# Patient Record
Sex: Female | Born: 1995 | Race: Black or African American | Hispanic: No | Marital: Single | Smoking: Never smoker
Health system: Southern US, Community
[De-identification: ages and names within clinical notes are randomized; demographics above are authoritative.]

## PROBLEM LIST (undated history)

## (undated) DIAGNOSIS — I10 Essential (primary) hypertension: Secondary | ICD-10-CM

## (undated) DIAGNOSIS — D649 Anemia, unspecified: Secondary | ICD-10-CM

## (undated) DIAGNOSIS — E669 Obesity, unspecified: Secondary | ICD-10-CM

## (undated) HISTORY — PX: WRIST SURGERY: SHX841

---

## 2020-12-04 ENCOUNTER — Emergency Department (HOSPITAL_BASED_OUTPATIENT_CLINIC_OR_DEPARTMENT_OTHER)
Admission: EM | Admit: 2020-12-04 | Discharge: 2020-12-04 | Disposition: A | Discharge status: Home / Self Care | Payer: Medicaid Other | Attending: Emergency Medicine | Admitting: Emergency Medicine

## 2020-12-04 ENCOUNTER — Emergency Department (HOSPITAL_BASED_OUTPATIENT_CLINIC_OR_DEPARTMENT_OTHER): Payer: Medicaid Other

## 2020-12-04 ENCOUNTER — Encounter (HOSPITAL_BASED_OUTPATIENT_CLINIC_OR_DEPARTMENT_OTHER): Payer: Self-pay | Admitting: *Deleted

## 2020-12-04 ENCOUNTER — Other Ambulatory Visit: Payer: Self-pay

## 2020-12-04 DIAGNOSIS — K8020 Calculus of gallbladder without cholecystitis without obstruction: Secondary | ICD-10-CM

## 2020-12-04 DIAGNOSIS — O99011 Anemia complicating pregnancy, first trimester: Secondary | ICD-10-CM | POA: Diagnosis not present

## 2020-12-04 DIAGNOSIS — O26611 Liver and biliary tract disorders in pregnancy, first trimester: Secondary | ICD-10-CM | POA: Diagnosis not present

## 2020-12-04 DIAGNOSIS — D649 Anemia, unspecified: Secondary | ICD-10-CM

## 2020-12-04 DIAGNOSIS — Z3A01 Less than 8 weeks gestation of pregnancy: Secondary | ICD-10-CM | POA: Diagnosis not present

## 2020-12-04 DIAGNOSIS — Z3490 Encounter for supervision of normal pregnancy, unspecified, unspecified trimester: Secondary | ICD-10-CM

## 2020-12-04 DIAGNOSIS — Z3201 Encounter for pregnancy test, result positive: Secondary | ICD-10-CM | POA: Insufficient documentation

## 2020-12-04 DIAGNOSIS — R072 Precordial pain: Secondary | ICD-10-CM | POA: Insufficient documentation

## 2020-12-04 DIAGNOSIS — R0789 Other chest pain: Secondary | ICD-10-CM

## 2020-12-04 DIAGNOSIS — O99891 Other specified diseases and conditions complicating pregnancy: Secondary | ICD-10-CM | POA: Diagnosis present

## 2020-12-04 DIAGNOSIS — R1011 Right upper quadrant pain: Secondary | ICD-10-CM

## 2020-12-04 HISTORY — DX: Calculus of gallbladder without cholecystitis without obstruction: K80.20

## 2020-12-04 LAB — CBC
HCT: 30.4 % — ABNORMAL LOW (ref 36.0–46.0)
Hemoglobin: 9.2 g/dL — ABNORMAL LOW (ref 12.0–15.0)
MCH: 19.7 pg — ABNORMAL LOW (ref 26.0–34.0)
MCHC: 30.3 g/dL (ref 30.0–36.0)
MCV: 65 fL — ABNORMAL LOW (ref 80.0–100.0)
Platelets: 589 10*3/uL — ABNORMAL HIGH (ref 150–400)
RBC: 4.68 MIL/uL (ref 3.87–5.11)
RDW: 19.7 % — ABNORMAL HIGH (ref 11.5–15.5)
WBC: 11.4 10*3/uL — ABNORMAL HIGH (ref 4.0–10.5)
nRBC: 0.2 % (ref 0.0–0.2)

## 2020-12-04 LAB — COMPREHENSIVE METABOLIC PANEL
ALT: 15 U/L (ref 0–44)
AST: 19 U/L (ref 15–41)
Albumin: 3.1 g/dL — ABNORMAL LOW (ref 3.5–5.0)
Alkaline Phosphatase: 69 U/L (ref 38–126)
Anion gap: 7 (ref 5–15)
BUN: 5 mg/dL — ABNORMAL LOW (ref 6–20)
CO2: 22 mmol/L (ref 22–32)
Calcium: 8.3 mg/dL — ABNORMAL LOW (ref 8.9–10.3)
Chloride: 104 mmol/L (ref 98–111)
Creatinine, Ser: 0.65 mg/dL (ref 0.44–1.00)
GFR, Estimated: >60 mL/min (ref >60)
Glucose, Bld: 93 mg/dL (ref 70–99)
Potassium: 3.4 mmol/L — ABNORMAL LOW (ref 3.5–5.1)
Sodium: 133 mmol/L — ABNORMAL LOW (ref 135–145)
Total Bilirubin: 0.4 mg/dL (ref 0.3–1.2)
Total Protein: 7.4 g/dL (ref 6.5–8.1)

## 2020-12-04 LAB — URINALYSIS, ROUTINE W REFLEX MICROSCOPIC
Bilirubin Urine: NEGATIVE
Glucose, UA: NEGATIVE mg/dL
Hgb urine dipstick: NEGATIVE
Ketones, ur: NEGATIVE mg/dL
Leukocytes,Ua: NEGATIVE
Nitrite: NEGATIVE
Protein, ur: NEGATIVE mg/dL
Specific Gravity, Urine: 1.015 (ref 1.005–1.030)
pH: 7 (ref 5.0–8.0)

## 2020-12-04 LAB — HCG, QUANTITATIVE, PREGNANCY: hCG, Beta Chain, Quant, S: 43 m[IU]/mL — ABNORMAL HIGH (ref <5)

## 2020-12-04 LAB — TROPONIN I (HIGH SENSITIVITY): Troponin I (High Sensitivity): 3 ng/L (ref <18)

## 2020-12-04 LAB — LIPASE, BLOOD: Lipase: 21 U/L (ref 11–51)

## 2020-12-04 LAB — PREGNANCY, URINE: Preg Test, Ur: POSITIVE — AB

## 2020-12-04 NOTE — ED Triage Notes (Signed)
Burning in her upper chest, abdominal cramping. Nausea a few nights ago.

## 2020-12-04 NOTE — Discharge Instructions (Addendum)
Begin taking a prenatal vitamin with iron.  Establish prenatal care with an OB specialist.  If you develop any lower abdominal/pelvic pain, vaginal bleeding, or other concerning symptoms regarding her pregnancy, please report to the [facility] to the maternity assessment unit at entrance C.  This is near the emergency department but not the same entrance.  Establish primary care.  It is important to have your hemoglobin rechecked.  Return emergency department tomorrow for ultrasound imaging of your gallbladder. Report to the front desk and inform them you are here for scheduled ultrasound. If you are having persistent symptoms tomorrow upon arrival, it is recommended you check into the ED for reevaluation instead. (you can have ultrasound done during re-evaluation)  You can take Tums as needed for heartburn. You can take Tylenol as needed for pain. Avoid heavy lifting at work for the next few days.  IMPORTANT PATIENT INSTRUCTIONS:  You have been scheduled for an Outpatient Ultrasound.     Your appointment has been scheduled for:  10:00 AM on 12/05/2020.   If your appointment is scheduled for a Saturday, Sunday or holiday, please go to the [facility] Emergency Department Registration Desk at least 15 minutes prior to your appointment time and tell them you are there for an ultrasound.     If your appointment is scheduled for a weekday (Monday - Friday), please go directly to the [facility] Radiology Department reception area at least 15 minutes prior to your appointment time and tell them you are there for an ultrasound.     Please call [phone] with questions.

## 2020-12-04 NOTE — ED Provider Notes (Signed)
[facility] EMERGENCY DEPARTMENT Provider Note   CSN: [id number] Arrival date & time: 12/04/20  1425     History Chief Complaint  Patient presents with  . Abdominal Pain  . Chest Pain    Lauren Morales is a 24 y.o. female without significant past medical history, presenting emergency department with complaint of upper chest pain that began a couple of days ago. Patient states pain feels like a burning pain that is worse with palpation. She states it feels like heartburn. She does do some heavy lifting at work at [employer] though does not recall a particular injury.  She is not having any shortness of breath or pain with breathing.  No associated diaphoresis or nausea.  It is not made worse or better with meals.  No unilateral leg pain or swelling.  She does report right upper quadrant crampy abdominal pain that comes and goes.  Her pain is not postprandial.  No other associated symptoms noted.  It is not severe.  No known history of gallstones.  Patient recently relocated here from [place], and has not established PCP.   Pregnancy test obtained in triage is positive.  She was unaware of this.  She is sexually active.  LMP is 11/04/2020.  She has had intercourse since her last menstrual period.  She is having no lower abdominal pain, no vaginal bleeding or abnormal discharge.  The history is provided by the patient.       History reviewed. No pertinent past medical history.  There are no problems to display for this patient.   History reviewed. No pertinent surgical history.   OB History   No obstetric history on file.     No family history on file.  Social History   Tobacco Use  . Smoking status: Never Smoker  . Smokeless tobacco: Never Used  Substance Use Topics  . Alcohol use: Yes  . Drug use: Not Currently    Home Medications Prior to Admission medications   Not on File    Allergies    Patient has no known allergies.  Review of Systems   Review  of Systems  All other systems reviewed and are negative.   Physical Exam Updated Vital Signs BP 121/88 (BP Location: Right Arm)   Pulse 88   Temp 98.4 F (36.9 C) (Oral)   Resp 18   Ht 5\' 5"  (1.651 m)   Wt (!) 153.9 kg   LMP 11/04/2020   SpO2 100%   BMI 56.46 kg/m   Physical Exam Vitals and nursing note reviewed.  Constitutional:      General: She is not in acute distress.    Appearance: She is well-developed and well-nourished. She is obese. She is not ill-appearing.  HENT:     Head: Normocephalic and atraumatic.  Eyes:     Conjunctiva/sclera: Conjunctivae normal.  Cardiovascular:     Rate and Rhythm: Normal rate and regular rhythm.     Pulses: Normal pulses.     Heart sounds: Normal heart sounds.  Pulmonary:     Effort: Pulmonary effort is normal. No respiratory distress.     Breath sounds: Normal breath sounds.  Chest:     Chest wall: Tenderness (Significant tenderness to superior portion of sternum) present.  Abdominal:     General: Bowel sounds are normal.     Palpations: Abdomen is soft.     Tenderness: There is abdominal tenderness in the right upper quadrant and epigastric area. There is no guarding or rebound.  Musculoskeletal:     Right lower leg: No edema.     Left lower leg: No edema.  Skin:    General: Skin is warm.  Neurological:     Mental Status: She is alert.  Psychiatric:        Mood and Affect: Mood and affect normal.        Behavior: Behavior normal.     ED Results / Procedures / Treatments   Labs (all labs ordered are listed, but only abnormal results are displayed) Labs Reviewed  COMPREHENSIVE METABOLIC PANEL - Abnormal; Notable for the following components:      Result Value   Sodium 133 (*)    Potassium 3.4 (*)    BUN 5 (*)    Calcium 8.3 (*)    Albumin 3.1 (*)    All other components within normal limits  CBC - Abnormal; Notable for the following components:   WBC 11.4 (*)    Hemoglobin 9.2 (*)    HCT 30.4 (*)    MCV 65.0  (*)    MCH 19.7 (*)    RDW 19.7 (*)    Platelets 589 (*)    All other components within normal limits  URINALYSIS, ROUTINE W REFLEX MICROSCOPIC - Abnormal; Notable for the following components:   APPearance HAZY (*)    All other components within normal limits  PREGNANCY, URINE - Abnormal; Notable for the following components:   Preg Test, Ur POSITIVE (*)    All other components within normal limits  HCG, QUANTITATIVE, PREGNANCY - Abnormal; Notable for the following components:   hCG, Beta Chain, Quant, S 43 (*)    All other components within normal limits  LIPASE, BLOOD  TROPONIN I (HIGH SENSITIVITY)    EKG EKG Interpretation  Date/Time:  Thursday December 04 2020 14:53:42 EST Ventricular Rate:  101 PR Interval:  112 QRS Duration: 70 QT Interval:  336 QTC Calculation: 435 R Axis:   34 Text Interpretation: Sinus tachycardia Nonspecific T wave abnormality Abnormal ECG Confirmed by Marianna Fuss ([id number]) on 12/04/2020 6:42:32 PM   Radiology DG Chest 2 View  Result Date: 12/04/2020 CLINICAL DATA:  Upper chest pain, abdominal cramping, nausea several nights prior EXAM: CHEST - 2 VIEW COMPARISON:  None. FINDINGS: Streaky opacities towards the lung bases, could reflect atelectasis or early infection in the appropriate clinical setting. No pneumothorax. No effusion. No convincing features of edema. The cardiomediastinal contours are unremarkable. No acute osseous or soft tissue abnormality. Telemetry leads overlie the chest. IMPRESSION: Streaky opacities towards the lung bases, could reflect atelectasis or early infection in the appropriate clinical setting. Electronically Signed   By: Kreg Shropshire M.D.   On: 12/04/2020 20:05    Procedures Procedures (including critical care time)  Medications Ordered in ED Medications - No data to display  ED Course  I have reviewed the triage vital signs and the nursing notes.  Pertinent labs & imaging results that were available during my  care of the patient were reviewed by me and considered in my medical decision making (see chart for details).  Clinical Course as of 12/04/20 2143  Thu Dec 04, 2020  1953 Discussed hCG level with on-call OB Dr. Mayford Knife.  He recommends routine outpatient OB follow-up as appropriate.  Reports likely very early gestation with recent conception. [JR]  2038 DG Chest 2 View No cough or fever, defer abx [JR]    Clinical Course User Index [JR] Hampton Wixom, [place] N, PA-C   MDM Rules/Calculators/A&P  Patient presenting for burning superior sternal pain that is reproducible with palpation, as well as some intermittent cramping right upper quadrant abdominal pain.  Sternal pain is described as heartburn that was easily reproducible with palpation.  She states she does some heavy lifting at [employer].  Consider chest wall strain versus costochondritis.  Right upper quadrant pain is reproducible on exam, no peritoneal signs.  She is afebrile with normal LFTs, no leukocytosis.  Doubt acute cholecystitis.  Ultrasound imaging is not currently available at this time of evening in the ED.  However, consider uncomplicated cholelithiasis.    EKG appears nonischemic.  Troponin is negative.  UA is negative.  Lipase is normal limits.  She does have some anemia noted though has no active bleeding and is asymptomatic without tachycardia or hypotension.   She is incidentally found to have positive pregnancy test today with quantitative  hCG of 43.  This is discussed with OB on-call Dr. Mayford Knife, recommends this is likely just very early pregnancy and is appropriate for routine outpatient follow-up given absence of concerning symptoms.  Patient is scheduled for right upper quadrant ultrasound, to return tomorrow to this ED for scheduled imaging to evaluate for cholelithiasis.  She is instructed to check back into the ED if she is having persistent symptoms or any worsening symptoms.  Patient is provided  with OB referral for routine prenatal care, as well as PCP referral to establish PCP, as well to recheck anemia.  Chest pain seems likely musculoskeletal in nature versus GERD.  Presentation is not consistent with PE, she has no shortness of breath, pleuritic pain, cough, fever, tachycardia, tachypnea or hypoxia, no unilateral leg pain or swelling.  Pregnancy is very early on, unlikely pose as risk factor for PE at this very early stage in pregnancy.  Recommend symptomatic management, strict return precautions.  Patient verbalized understanding, agrees with work-up and care plan at this time.  Patient presentation and work-up discussed with attending Dr. Stevie Kern, who guided treatment and care plan. Final Clinical Impression(s) / ED Diagnoses Final diagnoses:  RUQ abdominal pain  Sternum pain  Pregnancy at early stage  Anemia, unspecified type    Rx / DC Orders ED Discharge Orders    None       Kel Senn, [place] N, PA-C 12/04/20 2217    Milagros Loll, MD 12/05/20 1248

## 2020-12-05 ENCOUNTER — Emergency Department (HOSPITAL_BASED_OUTPATIENT_CLINIC_OR_DEPARTMENT_OTHER)
Admit: 2020-12-05 | Discharge: 2020-12-05 | Disposition: A | Discharge status: Home / Self Care | Payer: Medicaid Other | Attending: Emergency Medicine | Admitting: Emergency Medicine

## 2020-12-05 NOTE — ED Provider Notes (Signed)
Pt returned for a gallbladder US.  The [place] does show cholelithiasis with borderline gb wall thickening.  +murphy's sign.  I spoke with pt and pain has improved.  She is nontoxic.  I told her to check back in if she worsens.  She does not want to right now.   Pt is encouraged to avoid fatty and greasy foods.  F/u with general surgery.   Jacalyn Lefevre, MD 12/05/20 1126

## 2020-12-17 ENCOUNTER — Other Ambulatory Visit: Payer: Self-pay

## 2020-12-17 DIAGNOSIS — R109 Unspecified abdominal pain: Secondary | ICD-10-CM | POA: Diagnosis not present

## 2020-12-17 DIAGNOSIS — O4691 Antepartum hemorrhage, unspecified, first trimester: Secondary | ICD-10-CM | POA: Diagnosis not present

## 2020-12-17 DIAGNOSIS — Z3A01 Less than 8 weeks gestation of pregnancy: Secondary | ICD-10-CM | POA: Insufficient documentation

## 2020-12-17 DIAGNOSIS — O26891 Other specified pregnancy related conditions, first trimester: Secondary | ICD-10-CM | POA: Diagnosis not present

## 2020-12-17 LAB — PREGNANCY, URINE: Preg Test, Ur: POSITIVE — AB

## 2020-12-17 LAB — CBC WITH DIFFERENTIAL/PLATELET
Abs Immature Granulocytes: 0.05 10*3/uL (ref 0.00–0.07)
Basophils Absolute: 0.1 10*3/uL (ref 0.0–0.1)
Basophils Relative: 0 %
Eosinophils Absolute: 0.1 10*3/uL (ref 0.0–0.5)
Eosinophils Relative: 1 %
HCT: 34.4 % — ABNORMAL LOW (ref 36.0–46.0)
Hemoglobin: 9.8 g/dL — ABNORMAL LOW (ref 12.0–15.0)
Immature Granulocytes: 0 %
Lymphocytes Relative: 40 %
Lymphs Abs: 5 10*3/uL — ABNORMAL HIGH (ref 0.7–4.0)
MCH: 19.1 pg — ABNORMAL LOW (ref 26.0–34.0)
MCHC: 28.5 g/dL — ABNORMAL LOW (ref 30.0–36.0)
MCV: 66.9 fL — ABNORMAL LOW (ref 80.0–100.0)
Monocytes Absolute: 0.9 10*3/uL (ref 0.1–1.0)
Monocytes Relative: 7 %
Neutro Abs: 6.4 10*3/uL (ref 1.7–7.7)
Neutrophils Relative %: 52 %
Platelets: 627 10*3/uL — ABNORMAL HIGH (ref 150–400)
RBC: 5.14 MIL/uL — ABNORMAL HIGH (ref 3.87–5.11)
RDW: 19.9 % — ABNORMAL HIGH (ref 11.5–15.5)
WBC: 12.5 10*3/uL — ABNORMAL HIGH (ref 4.0–10.5)
nRBC: 0 % (ref 0.0–0.2)

## 2020-12-17 LAB — URINALYSIS, ROUTINE W REFLEX MICROSCOPIC
Bilirubin Urine: NEGATIVE
Glucose, UA: NEGATIVE mg/dL
Ketones, ur: NEGATIVE mg/dL
Leukocytes,Ua: NEGATIVE
Nitrite: NEGATIVE
Protein, ur: NEGATIVE mg/dL
Specific Gravity, Urine: 1.028 (ref 1.005–1.030)
pH: 5 (ref 5.0–8.0)

## 2020-12-17 LAB — COMPREHENSIVE METABOLIC PANEL
ALT: 10 U/L (ref 0–44)
AST: 14 U/L — ABNORMAL LOW (ref 15–41)
Albumin: 3.2 g/dL — ABNORMAL LOW (ref 3.5–5.0)
Alkaline Phosphatase: 75 U/L (ref 38–126)
Anion gap: 8 (ref 5–15)
BUN: 7 mg/dL (ref 6–20)
CO2: 21 mmol/L — ABNORMAL LOW (ref 22–32)
Calcium: 8.7 mg/dL — ABNORMAL LOW (ref 8.9–10.3)
Chloride: 105 mmol/L (ref 98–111)
Creatinine, Ser: 0.67 mg/dL (ref 0.44–1.00)
GFR, Estimated: >60 mL/min (ref >60)
Glucose, Bld: 100 mg/dL — ABNORMAL HIGH (ref 70–99)
Potassium: 3.8 mmol/L (ref 3.5–5.1)
Sodium: 134 mmol/L — ABNORMAL LOW (ref 135–145)
Total Bilirubin: 0.3 mg/dL (ref 0.3–1.2)
Total Protein: 8 g/dL (ref 6.5–8.1)

## 2020-12-17 NOTE — ED Triage Notes (Signed)
Pt reports that she found out that she was pregnant on 12/04/2020 and has since had brown/pink bleeding with cramping x 1day

## 2020-12-18 ENCOUNTER — Emergency Department (HOSPITAL_COMMUNITY)
Admission: EM | Admit: 2020-12-18 | Discharge: 2020-12-18 | Disposition: A | Discharge status: Home / Self Care | Payer: Medicaid Other | Attending: Emergency Medicine | Admitting: Emergency Medicine

## 2020-12-18 ENCOUNTER — Emergency Department (HOSPITAL_COMMUNITY): Payer: Medicaid Other

## 2020-12-18 DIAGNOSIS — O4690 Antepartum hemorrhage, unspecified, unspecified trimester: Secondary | ICD-10-CM

## 2020-12-18 DIAGNOSIS — O418X10 Other specified disorders of amniotic fluid and membranes, first trimester, not applicable or unspecified: Secondary | ICD-10-CM

## 2020-12-18 DIAGNOSIS — O468X1 Other antepartum hemorrhage, first trimester: Secondary | ICD-10-CM

## 2020-12-18 DIAGNOSIS — N939 Abnormal uterine and vaginal bleeding, unspecified: Secondary | ICD-10-CM

## 2020-12-18 LAB — WET PREP, GENITAL
Clue Cells Wet Prep HPF POC: NONE SEEN
Sperm: NONE SEEN
Trich, Wet Prep: NONE SEEN
Yeast Wet Prep HPF POC: NONE SEEN

## 2020-12-18 LAB — GC/CHLAMYDIA PROBE AMP (~~LOC~~) NOT AT ARMC
Chlamydia: NEGATIVE
Comment: NEGATIVE
Comment: NORMAL
Neisseria Gonorrhea: NEGATIVE

## 2020-12-18 LAB — HCG, QUANTITATIVE, PREGNANCY: hCG, Beta Chain, Quant, S: 8749 m[IU]/mL — ABNORMAL HIGH (ref <5)

## 2020-12-18 NOTE — ED Provider Notes (Signed)
[facility]-EMERGENCY DEPT Provider Note  CSN: [id number] Arrival date & time: 12/17/20 1937  Chief Complaint(s) Vaginal Bleeding  HPI Lauren Morales is a 25 y.o. female G1, P0 at 6 weeks 2 days her last menstrual cycle here for 2 days of vaginal bleeding.  Initially spotting but today she reports the pad full of blood.  No alleviating or aggravating factors.  She is endorsing abdominal cramping.  No nausea or vomiting.  No vaginal discharge.  No urinary symptoms.  Denies any prior history of STDs.  Seen on December 23 for right upper quadrant abdominal pain and noted to have cholelithiasis without acute cholecystitis.  Patient reports that the right upper quadrant pain has resolved.  HPI  Past Medical History No past medical history on file. There are no problems to display for this patient.  Home Medication(s) Prior to Admission medications   Not on File                                                                                                                                    Past Surgical History No past surgical history on file. Family History No family history on file.  Social History Social History   Tobacco Use  . Smoking status: Never Smoker  . Smokeless tobacco: Never Used  Substance Use Topics  . Alcohol use: Yes  . Drug use: Not Currently   Allergies Patient has no known allergies.  Review of Systems Review of Systems All other systems are reviewed and are negative for acute change except as noted in the HPI  Physical Exam Vital Signs  I have reviewed the triage vital signs BP (!) 146/82   Pulse 85   Temp 98.3 F (36.8 C) (Oral)   Resp 16   Ht 5\' 4"  (1.626 m)   Wt (!) 153.9 kg   LMP 11/04/2020 (Approximate)   SpO2 100%   BMI 58.24 kg/m   Physical Exam Vitals reviewed. Exam conducted with a chaperone present.  Constitutional:      General: She is not in acute distress.    Appearance: She is well-developed and  well-nourished. She is obese. She is not diaphoretic.  HENT:     Head: Normocephalic and atraumatic.     Right Ear: External ear normal.     Left Ear: External ear normal.     Nose: Nose normal.  Eyes:     General: No scleral icterus.    Extraocular Movements: EOM normal.     Conjunctiva/sclera: Conjunctivae normal.  Neck:     Trachea: Phonation normal.  Cardiovascular:     Rate and Rhythm: Normal rate and regular rhythm.  Pulmonary:     Effort: Pulmonary effort is normal. No respiratory distress.     Breath sounds: No stridor.  Abdominal:     General: There is no distension.  Genitourinary:    Labia:  Right: No rash.        Left: No rash.      Vagina: Normal. No bleeding.     Cervix: Discharge (scant, mucus) present. No cervical bleeding.     Uterus: Tender.      Adnexa:        Right: No tenderness.         Left: Tenderness present.   Musculoskeletal:        General: No edema. Normal range of motion.     Cervical back: Normal range of motion.  Neurological:     Mental Status: She is alert and oriented to person, place, and time.  Psychiatric:        Mood and Affect: Mood and affect normal.        Behavior: Behavior normal.     ED Results and Treatments Labs (all labs ordered are listed, but only abnormal results are displayed) Labs Reviewed  WET PREP, GENITAL - Abnormal; Notable for the following components:      Result Value   WBC, Wet Prep HPF POC MODERATE (*)    All other components within normal limits  CBC WITH DIFFERENTIAL/PLATELET - Abnormal; Notable for the following components:   WBC 12.5 (*)    RBC 5.14 (*)    Hemoglobin 9.8 (*)    HCT 34.4 (*)    MCV 66.9 (*)    MCH 19.1 (*)    MCHC 28.5 (*)    RDW 19.9 (*)    Platelets 627 (*)    Lymphs Abs 5.0 (*)    All other components within normal limits  COMPREHENSIVE METABOLIC PANEL - Abnormal; Notable for the following components:   Sodium 134 (*)    CO2 21 (*)    Glucose, Bld 100 (*)     Calcium 8.7 (*)    Albumin 3.2 (*)    AST 14 (*)    All other components within normal limits  URINALYSIS, ROUTINE W REFLEX MICROSCOPIC - Abnormal; Notable for the following components:   APPearance CLOUDY (*)    Hgb urine dipstick LARGE (*)    Bacteria, UA RARE (*)    All other components within normal limits  HCG, QUANTITATIVE, PREGNANCY - Abnormal; Notable for the following components:   hCG, Beta Chain, Quant, S 8,749 (*)    All other components within normal limits  PREGNANCY, URINE - Abnormal; Notable for the following components:   Preg Test, Ur POSITIVE (*)    All other components within normal limits  URINE CULTURE  GC/CHLAMYDIA PROBE AMP ([place]) NOT AT [facility]                                                                                                                         EKG  EKG Interpretation  Date/Time:    Ventricular Rate:    PR Interval:    QRS Duration:   QT Interval:    QTC Calculation:   R Axis:  Text Interpretation:        Radiology US OB LESS THAN 14 WEEKS WITH OB TRANSVAGINAL  Result Date: 12/18/2020 CLINICAL DATA:  Pelvic pain.  Vaginal bleeding. EXAM: OBSTETRIC <14 WK [place] AND TRANSVAGINAL OB US TECHNIQUE: Both transabdominal and transvaginal ultrasound examinations were performed for complete evaluation of the gestation as well as the maternal uterus, adnexal regions, and pelvic cul-de-sac. Transvaginal technique was performed to assess early pregnancy. COMPARISON:  No prior. FINDINGS: Intrauterine gestational sac: Single Yolk sac:  Present Embryo:  Present Cardiac Activity: Present Heart Rate: 92 bpm CRL: 2.3 mm   5 w    d                  [place] EDC: 08/15/2021 Subchorionic hemorrhage:  Moderate subchorionic hemorrhage noted. Maternal uterus/adnexae: Unremarkable.  No free fluid. IMPRESSION: 1. Single viable intrauterine pregnancy of 5 weeks 5 days. Fetal heart rate 92 beats per minute. 2.  Moderate size subchorionic hemorrhage. Electronically Signed    By: Marcello Moores  Register   On: 12/18/2020 05:30    Pertinent labs & imaging results that were available during my care of the patient were reviewed by me and considered in my medical decision making (see chart for details).  Medications Ordered in ED Medications - No data to display                                                                                                                                  Procedures Procedures  (including critical care time)  Medical Decision Making / ED Course I have reviewed the nursing notes for this encounter and the patient's prior records (if available in EHR or on provided paperwork).   Lauren Morales was evaluated in Emergency Department on 12/18/2020 for the symptoms described in the history of present illness. She was evaluated in the context of the global COVID-19 pandemic, which necessitated consideration that the patient might be at risk for infection with the SARS-CoV-2 virus that causes COVID-19. Institutional protocols and algorithms that pertain to the evaluation of patients at risk for COVID-19 are in a state of rapid change based on information released by regulatory bodies including the CDC and federal and state organizations. These policies and algorithms were followed during the patient's care in the ED.  Patient presents with vaginal bleeding Known pregnancy estimated at 6 weeks 2 days per last menstrual period Pelvic exam with no blood in the vaginal vault. Scant discharge Wet prep negative for bacterial vaginosis, yeast or trichomonas. Exam not consistent with cervicitis from GC/chlamydia but swab sent. Pelvic ultrasound notable for IUP at 5 weeks 5 days with moderate subchorionic hemorrhage. UA without evidence of infection.  Patient has not establish care with a obstetrician.  Recommended close follow-up to monitor fetus and subchorionic hemorrhage.  Normal urinary yeah but you raise me up      Final Clinical Impression(s) /  ED Diagnoses Final diagnoses:  Vaginal  bleeding   The patient appears reasonably screened and/or stabilized for discharge and I doubt any other medical condition or other [facility] requiring further screening, evaluation, or treatment in the ED at this time prior to discharge. Safe for discharge with strict return precautions.  Disposition: Discharge  Condition: Good  I have discussed the results, Dx and Tx plan with the patient/family who expressed understanding and agree(s) with the plan. Discharge instructions discussed at length. The patient/family was given strict return precautions who verbalized understanding of the instructions. No further questions at time of discharge.    ED Discharge Orders    None      Follow Up: [facility] [address] Floor, [address] [id number] mc [place] [address] [phone] Call  To schedule an appointment for close follow up      This chart was dictated using voice recognition software.  Despite best efforts to proofread,  errors can occur which can change the documentation meaning.   Nira Conn, MD 12/18/20 [phone]

## 2020-12-19 LAB — URINE CULTURE
Culture: NO GROWTH
Special Requests: NORMAL

## 2021-01-19 ENCOUNTER — Encounter: Payer: Self-pay | Admitting: Obstetrics & Gynecology

## 2021-01-19 ENCOUNTER — Other Ambulatory Visit (HOSPITAL_COMMUNITY)
Admission: RE | Admit: 2021-01-19 | Discharge: 2021-01-19 | Disposition: A | Discharge status: Home / Self Care | Payer: Medicaid Other | Source: Ambulatory Visit | Attending: Obstetrics & Gynecology | Admitting: Obstetrics & Gynecology

## 2021-01-19 ENCOUNTER — Ambulatory Visit (INDEPENDENT_AMBULATORY_CARE_PROVIDER_SITE_OTHER): Payer: Self-pay | Admitting: Obstetrics & Gynecology

## 2021-01-19 ENCOUNTER — Other Ambulatory Visit: Payer: Self-pay

## 2021-01-19 VITALS — BP 154/104 | HR 128 | Wt 334.7 lb

## 2021-01-19 DIAGNOSIS — R7303 Prediabetes: Secondary | ICD-10-CM

## 2021-01-19 DIAGNOSIS — Z3490 Encounter for supervision of normal pregnancy, unspecified, unspecified trimester: Secondary | ICD-10-CM | POA: Insufficient documentation

## 2021-01-19 DIAGNOSIS — O10919 Unspecified pre-existing hypertension complicating pregnancy, unspecified trimester: Secondary | ICD-10-CM | POA: Insufficient documentation

## 2021-01-19 DIAGNOSIS — Z3A10 10 weeks gestation of pregnancy: Secondary | ICD-10-CM

## 2021-01-19 DIAGNOSIS — O99011 Anemia complicating pregnancy, first trimester: Secondary | ICD-10-CM

## 2021-01-19 DIAGNOSIS — O10911 Unspecified pre-existing hypertension complicating pregnancy, first trimester: Secondary | ICD-10-CM

## 2021-01-19 DIAGNOSIS — O0991 Supervision of high risk pregnancy, unspecified, first trimester: Secondary | ICD-10-CM

## 2021-01-19 DIAGNOSIS — D509 Iron deficiency anemia, unspecified: Secondary | ICD-10-CM

## 2021-01-19 DIAGNOSIS — O99210 Obesity complicating pregnancy, unspecified trimester: Secondary | ICD-10-CM

## 2021-01-19 DIAGNOSIS — O0990 Supervision of high risk pregnancy, unspecified, unspecified trimester: Secondary | ICD-10-CM | POA: Insufficient documentation

## 2021-01-19 DIAGNOSIS — I10 Essential (primary) hypertension: Secondary | ICD-10-CM | POA: Insufficient documentation

## 2021-01-19 MED ORDER — ASPIRIN EC 81 MG PO TBEC
81.0000 mg | DELAYED_RELEASE_TABLET | Freq: Every day | ORAL | 2 refills | Status: DC
Start: 2021-01-19 — End: 2021-08-07

## 2021-01-19 MED ORDER — NIFEDIPINE ER OSMOTIC RELEASE 30 MG PO TB24
30.0000 mg | ORAL_TABLET | Freq: Every day | ORAL | 2 refills | Status: DC
Start: 2021-01-19 — End: 2021-03-16

## 2021-01-19 NOTE — Patient Instructions (Addendum)
AREA PEDIATRIC/FAMILY PRACTICE PHYSICIANS  [place] ([address]) . [facility] o Chambliss, MD; Eniola, MD; Hale, MD; Hensel, MD; McDiarmid, MD; McIntyer, MD; Neal, MD; Walden, MD o [address] [place], [place] [address] o [phone] o Mon-Fri 8:30-12:30, 1:30-5:00 o Providers come to see babies at [facility] o Accepting Medicaid . [facility] o Limited providers who accept newborns: Koirala, MD; Morrow, MD; Wolters, MD o [address], [place], [place] [address] o [phone] o Mon-Fri 8:00-5:30 o Babies seen by providers at [facility] o Does NOT accept Medicaid o Please call early in hospitalization for appointment (limited availability)  . [facility] o Mulberry, MD o [address] [place], [place] [address] o [phone] o Mon, Tue, Thur, Fri 8:30-5:00, Wed 10:00-7:00 (closed 1-2pm) o Babies seen by [facility] providers o Accepting Medicaid . [facility] - Pediatrician o Rubin, MD o [address], [place], [place] [address] o [phone] o Mon-Fri 8:30-5:00, Sat 8:30-12:00 o Provider comes to see babies at [facility] o Accepting Medicaid o Must have been referred from current patients or contacted office prior to delivery . [facility] ([facility]) o Brown, MD; Chandler, MD; Ettefagh, MD; Grant, MD; Lester, MD; McCormick, MD; McQueen, MD; Prose, MD; Simha, MD; Stanley, MD; Stryffeler, NP; Tebben, NP o [address], [place], [place] [address] o [phone] o Mon, Tue, Thur, Fri 8:30-5:30, Wed 9:30-5:30, Sat 8:30-12:30 o Babies seen by [facility] providers o Accepting Medicaid o Only accepting infants of first-time parents or siblings of current patients o Hospital discharge coordinator will make follow-up appointment . Jack Amos o [address],  [place], [place]  [address] o [phone]   Fax - [phone] . [facility] o [address], [place], [place]  [address] o Phone - [phone]   Fax - [phone] . Shilpa Gosrani o [address], [place], [place]  [address] o [phone]  [place] ([address]) . [facility] o Bates, MD; [place], MD; Cooper, Cox, MD; MD; Davis, MD; Dovico, MD; Ettefaugh, MD; Little, MD; Lowe, MD; Keiffer, MD; Melvin, MD; Sumner, MD; Williams, MD o [address], [place], [place] [address] o [phone] o Mon-Fri 8:30-5:00 (extended evenings Mon-Thur as needed), Sat-Sun 10:00-1:00 o Providers come to see babies at [facility] o Accepting Medicaid for families of first-time babies and families with all children in the household age 3 and under. Must register with office prior to making appointment (M-F only). . [facility] o Henson, NP; Knapp, MD; Lalonde, MD; Tysinger, PA o [address] [place], [place] [address] o [phone] o Mon-Fri 8:00-5:00 o Babies seen by providers at [facility] o Does NOT accept Medicaid/Commercial Insurance Only . [facility] [place] ([facility])  o Artis, MD; Barnes, MD; Bratton, MD; Coccaro, MD; Lockett Gardner, MD; Kramer, MD; Marshall, MD; Netherton, MD; Poleto, MD; Skinner, MD o [address] [place], [place] [address] o [phone] o Mon-Fri 8:30-5:30, Sat (Oct.-Mar.) 9:00-1:00 o Babies seen by providers at [facility] o Accepting Medicaid  [place] ([address]) . [facility] o Reid, MD; Warner, MD o [address], [place],  [address] o [phone] o Mon-Fri 8:30-5:00, Sat 8:30-12:00 o Providers come to see babies at [facility] o Does NOT accept Medicaid . [facility] o Becker, PA; Hagler, MD; Scifres, PA; Sun, MD; Swayne, MD o [address],  [place], [place] [address] o [phone] o Mon-Fri 8:00-5:00 o Babies seen by providers at [facility] o Does NOT accept Medicaid o Only accepting babies of parents who are patients o Please call early in hospitalization for appointment (limited availability) . [facility] o Clark, MD; Frye, MD; Kelleher, MD; Mack, NP; Miller, MD; O'Keller, MD; Patterson, NP; Pudlo, MD; Puzio, MD; Thomas, MD; Tucker, MD; Twiselton, MD o [address], [place], [place] [address] o [phone] o Mon-Fri 8:00-5:00, Sat 9:00-12:00 o Providers come to see babies at [facility] o Does NOT accept Medicaid  [place] ([address]) . [facility] o Limited providers accepting new patients: Brake, NP; Wharton, PA o [address], [place], [place] [address] o [phone] o Mon-Fri 8:00-5:00 o Babies seen by providers at [facility] o Does NOT accept Medicaid o Only accepting babies of parents who are patients o Please call early in hospitalization for appointment (limited availability) . [facility] o Gay, MD; Quinlan, MD o [address] [place], [place] [address] o [phone] (press 1 to schedule appointment) o Mon-Fri 8:00-5:00 o Providers come to see babies at [facility] o Does NOT accept Medicaid . [facility] o Mazer, MD o [address] [place], [place] [address] o [phone] o Mon-Fri 8:30-5:00 (lunch 12:30-1:00), extended hours by appointment only Wed 5:00-6:30 o Babies seen by [facility] providers o Accepting Medicaid . [facility] o Banks, MD; Jordan, MD; Koberlein, MD o [address], [place], [place] [address] o [phone] o Mon-Fri 8:00-5:00 o Babies seen by [facility] providers o Does NOT accept Medicaid . [facility] o Parker, MD; Hunter, MD; Wallace, DO o [address] [place], [place]  [address] o [phone] o Mon-Fri 8:00-5:00 o Babies seen by [facility] providers o Does NOT accept Medicaid . [facility] o Brandon, PA; Brecken, PA; Christy, NP; Dees, MD; DeClaire, MD; DeWeese, MD; Hansen, NP; Mills, NP; Parrish, NP; Smoot, NP; Summer, MD; Vapne, MD o [address] [place], [place] [address] o [phone] o Mon-Fri 8:30-5:00, Sat 10:00-1:00 o Providers come to see babies at [facility] o Does NOT accept Medicaid o Free prenatal information session Tuesdays at 4:45pm . [facility] o Bouska, MD; Gordon, PA; Jeffery, PA; Weber, PA o [address] [place] [address] o [phone] o Mon-Fri 7:30-5:30 o Babies seen by [facility] providers . [facility] o [address], [place], [place], [place]  [address] o [phone]   Fax - [phone]  [place] ([phone]) . [facility] o [place]ese, [place] o [address] [place], [place] [address] o [phone] o Mon-Thur 8:00-6:00 o Providers come to see babies at [facility] o Accepting Medicaid . [facility] o Anderson, NP; Badger, MD; Beal, PA; Spencer, PA o [address] [place],  [address] o [phone] o Mon-Thur 7:30-7:30, Fri 7:30-4:30 o Babies seen by [facility] providers o Accepting Medicaid . [facility] o Agbuya, MD; Klett, NP; Romgoolam, MD o [address], ,  [address] o [phone] o Mon-Fri 8:30-5:00, Sat 8:30-12:00 o Providers come to see babies at [facility] o Accepting Medicaid o Must have "Meet & Greet" appointment at office prior to delivery . [facility]  ([facility] ) o McCord,   MD; Juleen China, MD; Clydene Laming, [address], [place], [place] [address] o [phone] o Mon-Wed 8:00-6:00, Thur-Fri 8:00-5:00, Sat 9:00-12:00 o Providers come to  see babies at [facility] o Does NOT accept Medicaid o Only accepting siblings of current patients . [facility] [place], [place], [place], [place]  [address] o [phone]   Fax [phone] . [facility] [place] [address], [place], [place]  [address] o [phone]   Fax - Morton Gorman [phone] & [phone]) . Therapist, music at [place], DO; [place], [address] [place], [place] [address] o [phone] o Mon-Fri 7:00-5:00 o Babies seen by [facility] providers o Does NOT accept Medicaid . [place], MD; [place], [place]; [place], [place], [place], [place] [address] o [phone] o Mon-Fri 8:00-5:00 o Babies seen by [facility] providers o Accepting Medicaid . Lamont, MD; [place], [place]; [place], NP; [place], [place], [place], [place] [address] o [phone] o Mon-Fri 8:00-5:00 o Babies seen by providers at [facility] [place] [phone]) . [facility] [place], [place] o [address] [place], [place] [address] o [phone] o Mon-Fri 8:00-5:00 o Babies seen by [facility] providers o Does NOT accept Medicaid o Limited availability, please call early in hospitalization to schedule follow-up . [facility] Leilani Merl, PA; Maisie Fus, MD; [place], MD; [place], [place]; Jeannine Kitten, MD; [place], [place] [facility] [address], [place], [place] [address] o [phone] o Mon-Fri 8:30-5:00, Sat 9:00-12:00 o Babies seen by providers at [facility] o Accepting Medicaid o Please register online then schedule online or call office o [email or web address] . [place] [facility]  [place]) Kristian Covey, NP; Dwyane Dee, MD; Leonidas Romberg, PA o [address] [place], [place], [place] [address] o [phone] o Mon-Fri 8:00-5:00 o Babies seen by providers at [place] o Accepting Medicaid . [place] ([facility]) Dairl Ponder, MD; Rayvon Char, NP; Melina Modena, MD o [address] [place], [place], [place] [address] o [phone] o Mon-Fri 8:00-5:30, Sat&Sun by appointment (phones open at 8:30) o Babies seen by [facility] providers o Accepting Medicaid o Must be a first-time baby or sibling of current patient . [place], [address], [place], [place]  [address] o [phone]   Fax - [phone]  [place] [phone] & [phone]) . [place], [place]; [place], [place]; Benjamine Mola, MD; [place], [place]; Harrell Lark, MD o [address] [place], [place] [address] o [phone] o Mon-Thur 8:00-7:00, Fri 8:00-5:00, Sat 8:00-12:00, Sun 9:00-12:00 o Babies seen by [facility] providers o Accepting Medicaid . [facility], MD; Ruthann Cancer, MD; [facility], MD o [address], [place], [place] [address] o [phone] o Mon-Thur 8:00-5:00 o Babies seen by providers at [facility] o Accepting Medicaid . [facility] at [place], MD; Coe[place], MD; Amedeo Plenty, MD; Bobby Rumpf, MD; List, MD; Lavonia Drafts, MD; Ruthann Cancer, MD; Selinda Eon, MD; Audie Box, MD; Jim Like, MD; Christie Nottingham, MD; Hubbard Hartshorn, MD; Modena Nunnery, MD o [address] [place], [place]  [address] o [phone] o Mon-Fri 8:00-5:30, Sat (Oct.-Mar.) 9:00-1:00 o Babies seen by providers at [facility] o Accepting Medicaid o Must fill out new patient packet, available online at [email or web address] . [facility] [place] [facility]) Simone Curia, NP; Tiburcio Pea, NP; Tresa Endo, NP; Whitney Post, MD;  [place], [place]; Hennie Duos, MD; Wynne Dust, MD; Kavin Leech, NP o [address], [place], [place] [address] o [phone] o Mon-Thur 8:00-5:30, Fri 8:00-5:00 o Babies seen by providers at [facility] o Accepting [place] [place] [phone]) . [facility] o [employer], [place]; Rio en [place]io, MD; Tanya Nones, MD; [place], [place] o [address] [place], [place] [address] o [phone] o Mon-Fri 8:00-5:00 o Babies seen by providers at [facility] o Accepting [facility] [phone]) . [facility] o [place], DO; Lenise Arena, MD; [place], [place] o [address], [place], [place] [address] o [phone] o Mon-Fri 8:00-5:00 o Babies seen by providers at [facility] o Does NOT accept Medicaid o Limited appointment availability, please call early in hospitalization  . Nature conservation officer at [facility] o Merino, DO; [place], MD o [address] [place], [place] [address] o [phone] o Mon-Fri 8:00-5:00 o Babies seen by [facility] providers o Does NOT accept Medicaid . [facility] Lorrine Kin, MD; Ninetta Lights, MD; [place], [place]; [place], MD o [address], [place], [place] [address] o [phone] o Mon-Fri 8:00-5:00 o After hours clinic [facility] [place], [place] [address]) [phone] Mon-Fri 5:00-8:00, Sat 12:00-6:00, Sun 10:00-4:00 o Babies seen by [facility] providers o Accepting Medicaid . [facility] o [address] [place], [place]  [address] o [phone]   Fax - [phone]  [place] [phone]) . Nature conservation officer at [facility], MD o [address] [place], [place], [place] [address] o [phone] o Mon-Fri 8:00-5:00 o Babies seen by [facility] providers o Does NOT accept Medicaid . [facility] [place] [facility] [place] [facility] [place]) Tomi Likens, MD o [address] [place] [address], [place], [place]  [address] o [phone] o Mon-Thur 8:00-7:00, Fri 8:00-5:00, Sat 8:00-12:00 o Babies seen by providers at [facility] o Accepting Medicaid - but does not have vaccinations in office (must be received elsewhere) o Limited availability, please call early in hospitalization  [place] ([address]) . [facility]  o Wyvonne Lenz, MD o [address], [place] [address] o [phone]  Fax [phone]  Hypertension During Pregnancy High blood pressure (hypertension) is when the force of blood pumping through the arteries is high enough to cause problems with your health. Arteries are blood vessels that carry blood from the heart throughout the body. Hypertension during pregnancy can cause problems for you and your baby. It can be mild or severe. There are different types of hypertension that can happen during pregnancy. These include:  Chronic hypertension. This happens when you had high blood pressure before you became pregnant, and it continues during the pregnancy. Hypertension that develops before you are [redacted] weeks pregnant and continues during the pregnancy is also called chronic hypertension. If you have chronic hypertension, it will not go away after you have your baby. You will need follow-up visits with your health care provider after you have your baby. Your health care provider may want you to keep taking medicine for your blood pressure.  Gestational hypertension. This is hypertension that develops  after the 20th week of pregnancy. Gestational hypertension usually goes away after you have your baby, but your health care provider will need to monitor your blood pressure to make sure that it is getting better.  Postpartum hypertension. This is high blood pressure that was present before delivery and continues after delivery or that starts after delivery. This usually occurs within 48 hours after childbirth but may occur up to 6 weeks after giving birth. When  hypertension during pregnancy is severe, it is a medical emergency that requires treatment right away. How does this affect me? Women who have hypertension during pregnancy have a greater chance of developing hypertension later in life or during future pregnancies. In some cases, hypertension during pregnancy can cause serious complications, such as:  Stroke.  Heart attack.  Injury to other organs, such as kidneys, lungs, or liver.  Preeclampsia.  A condition called hemolysis, elevated liver enzymes, and low platelet count (HELLP) syndrome.  Convulsions or seizures.  Placental abruption. How does this affect my baby? Hypertension during pregnancy can affect your baby. Your baby may:  Be born early (prematurely).  Not weigh as much as he or she should at birth (low birth weight).  Not tolerate labor well, leading to an unplanned cesarean delivery. This condition may also result in a baby's death before birth (stillbirth). What are the risks? There are certain factors that make it more likely for you to develop hypertension during pregnancy. These include:  Having hypertension during a previous pregnancy or a family history of hypertension.  Being overweight.  Being age 535 or older.  Being pregnant for the first time.  Being pregnant with more than one baby.  Becoming pregnant using fertilization methods, such as IVF (in vitro fertilization).  Having other medical problems, such as diabetes, kidney disease, or lupus. What can I do to lower my risk? The exact cause of hypertension during pregnancy is not known. You may be able to lower your risk by:  Maintaining a healthy weight.  Eating a healthy and balanced diet.  Following your health care provider's instructions about treating any long-term conditions that you had before becoming pregnant. It is very important to keep all of your prenatal care appointments. Your health care provider will check your blood pressure  and make sure that your pregnancy is progressing as expected. If a problem is found, early treatment can prevent complications.   How is this treated? Treatment for hypertension during pregnancy varies depending on the type of hypertension you have and how serious it is.  If you were taking medicine for high blood pressure before you became pregnant, talk with your health care provider. You may need to change medicine during pregnancy because some medicines, like ACE inhibitors, may not be considered safe for your baby.  If you have gestational hypertension, your health care provider may order medicine to treat this during pregnancy.  If you are at risk for preeclampsia, your health care provider may recommend that you take a low-dose aspirin during your pregnancy.  If you have severe hypertension, you may need to be hospitalized so you and your baby can be monitored closely. You may also need to be given medicine to lower your blood pressure.  In some cases, if your condition gets worse, you may need to deliver your baby early. Follow these instructions at home: Eating and drinking  Drink enough fluid to keep your urine pale yellow.  Avoid caffeine.   Lifestyle  Do not use any products that  contain nicotine or tobacco. These products include cigarettes, chewing tobacco, and vaping devices, such as e-cigarettes. If you need help quitting, ask your health care provider.  Do not use alcohol or drugs.  Avoid stress as much as possible.  Rest and get plenty of sleep.  Regular exercise can help to reduce your blood pressure. Ask your health care provider what kinds of exercise are best for you. General instructions  Take over-the-counter and prescription medicines only as told by your health care provider.  Keep all prenatal and follow-up visits. This is important. Contact a health care provider if:  You have symptoms that your health care provider told you may require more treatment  or monitoring, such as: ? Headaches. ? Nausea or vomiting. ? Abdominal pain. ? Dizziness. ? Light-headedness. Get help right away if:  You have symptoms of serious complications, such as: ? Severe abdominal pain that does not get better with treatment. ? A severe headache that does not get better, blurred vision, or double vision. ? Vomiting that does not get better. ? Sudden, rapid weight gain or swelling in your hands, ankles, or face. ? Vaginal bleeding. ? Blood in your urine. ? Shortness of breath or chest pain. ? Weakness on one side of your body or difficulty speaking.  Your baby is not moving as much as usual. These symptoms may represent a serious problem that is an emergency. Do not wait to see if the symptoms will go away. Get medical help right away. Call your local emergency services (911 in the [place] Do not drive yourself to the hospital. Summary  Hypertension during pregnancy can cause problems for you and your baby.  Treatment for hypertension during pregnancy varies depending on the type of hypertension you have and how serious it is.  Keep all prenatal and follow-up visits. This is important.  Get help right away if you have symptoms of serious complications related to high blood pressure. This information is not intended to replace advice given to you by your health care provider. Make sure you discuss any questions you have with your health care provider. Document Revised: 08/21/2020 Document Reviewed: 08/21/2020 Elsevier Patient Education  2021 Elsevier Inc.  Obstetrics: Normal and Problem Pregnancies (7th ed., pp. 102-121). [place], [place]: Elsevier."> Textbook of Family Medicine (9th ed., pp. [phone]). [place]: Elsevier [place].">  First Trimester of Pregnancy  The first trimester of pregnancy starts on the first day of your last menstrual period until the end of week 12. This is months 1 through 3 of pregnancy. A week after a sperm fertilizes an  egg, the egg will implant into the wall of the uterus and begin to develop into a baby. By the end of 12 weeks, all the baby's organs will be formed and the baby will be 2-3 inches in size. Body changes during your first trimester Your body goes through many changes during pregnancy. The changes vary and generally return to normal after your baby is born. Physical changes  You may gain or lose weight.  Your breasts may begin to grow larger and become tender. The tissue that surrounds your nipples (areola) may become darker.  Dark spots or blotches (chloasma or mask of pregnancy) may develop on your face.  You may have changes in your hair. These can include thickening or thinning of your hair or changes in texture. Health changes  You may feel nauseous, and you may vomit.  You may have heartburn.  You may develop headaches.  You may  develop constipation.  Your gums may bleed and may be sensitive to brushing and flossing. Other changes  You may tire easily.  You may urinate more often.  Your menstrual periods will stop.  You may have a loss of appetite.  You may develop cravings for certain kinds of food.  You may have changes in your emotions from day to day.  You may have more vivid and strange dreams. Follow these instructions at home: Medicines  Follow your health care provider's instructions regarding medicine use. Specific medicines may be either safe or unsafe to take during pregnancy. Do not take any medicines unless told to by your health care provider.  Take a prenatal vitamin that contains at least 600 micrograms (mcg) of folic acid. Eating and drinking  Eat a healthy diet that includes fresh fruits and vegetables, whole grains, good sources of protein such as meat, eggs, or tofu, and low-fat dairy products.  Avoid raw meat and unpasteurized juice, milk, and cheese. These carry germs that can harm you and your baby.  If you feel nauseous or you  vomit: ? Eat 4 or 5 small meals a day instead of 3 large meals. ? Try eating a few soda crackers. ? Drink liquids between meals instead of during meals.  You may need to take these actions to prevent or treat constipation: ? Drink enough fluid to keep your urine pale yellow. ? Eat foods that are high in fiber, such as beans, whole grains, and fresh fruits and vegetables. ? Limit foods that are high in fat and processed sugars, such as fried or sweet foods. Activity  Exercise only as directed by your health care provider. Most people can continue their usual exercise routine during pregnancy. Try to exercise for 30 minutes at least 5 days a week.  Stop exercising if you develop pain or cramping in the lower abdomen or lower back.  Avoid exercising if it is very hot or humid or if you are at high altitude.  Avoid heavy lifting.  If you choose to, you may have sex unless your health care provider tells you not to. Relieving pain and discomfort  Wear a good support bra to relieve breast tenderness.  Rest with your legs elevated if you have leg cramps or low back pain.  If you develop bulging veins (varicose veins) in your legs: ? Wear support hose as told by your health care provider. ? Elevate your feet for 15 minutes, 3-4 times a day. ? Limit salt in your diet. Safety  Wear your seat belt at all times when driving or riding in a car.  Talk with your health care provider if someone is verbally or physically abusive to you.  Talk with your health care provider if you are feeling sad or have thoughts of hurting yourself. Lifestyle  Do not use hot tubs, steam rooms, or saunas.  Do not douche. Do not use tampons or scented sanitary pads.  Do not use herbal remedies, alcohol, illegal drugs, or medicines that are not approved by your health care provider. Chemicals in these products can harm your baby.  Do not use any products that contain nicotine or tobacco, such as cigarettes,  e-cigarettes, and chewing tobacco. If you need help quitting, ask your health care provider.  Avoid cat litter boxes and soil used by cats. These carry germs that can cause birth defects in the baby and possibly loss of the unborn baby (fetus) by miscarriage or stillbirth. General instructions  During routine  prenatal visits in the first trimester, your health care provider will do a physical exam, perform necessary tests, and ask you how things are going. Keep all follow-up visits. This is important.  Ask for help if you have counseling or nutritional needs during pregnancy. Your health care provider can offer advice or refer you to specialists for help with various needs.  Schedule a dentist appointment. At home, brush your teeth with a soft toothbrush. Floss gently.  Write down your questions. Take them to your prenatal visits. Where to find more information  American Pregnancy Association: americanpregnancy.org  [employer]st[employer] Gynecologists: https://www.todd-brady.net/  Office on [employer] [facility]: MightyReward.co.nz Contact a health care provider if you have:  Dizziness.  A fever.  Mild pelvic cramps, pelvic pressure, or nagging pain in the abdominal area.  Nausea, vomiting, or diarrhea that lasts for 24 hours or longer.  A bad-smelling vaginal discharge.  Pain when you urinate.  Known exposure to a contagious illness, such as chickenpox, measles, Zika virus, HIV, or hepatitis. Get help right away if you have:  Spotting or bleeding from your vagina.  Severe abdominal cramping or pain.  Shortness of breath or chest pain.  Any kind of trauma, such as from a fall or a car crash.  New or increased pain, swelling, or redness in an arm or leg. Summary  The first trimester of pregnancy starts on the first day of your last menstrual period until the end of week 12 (months 1 through 3).  Eating 4 or 5 small meals a day rather than 3  large meals may help to relieve nausea and vomiting.  Do not use any products that contain nicotine or tobacco, such as cigarettes, e-cigarettes, and chewing tobacco. If you need help quitting, ask your health care provider.  Keep all follow-up visits. This is important. This information is not intended to replace advice given to you by your health care provider. Make sure you discuss any questions you have with your health care provider. Document Revised: 05/07/2020 Document Reviewed: 03/13/2020 Elsevier Patient Education  2021 [employer].

## 2021-01-19 NOTE — Progress Notes (Signed)
History:   Lauren Morales is a 25 y.o. G1P0 at [redacted]w[redacted]d by early ultrasound being seen today for her first obstetrical visit.  Her obstetrical history is significant for mrobid obesity. Patient does intend to breast feed. Pregnancy history fully reviewed.  Patient reports occasional spotting. Had ultrasound around [redacted] weeks gestation that showed moderate subchorionic hemorrhage.      HISTORY: OB History  Gravida Para Term Preterm AB Living  1 0 0 0 0 0  SAB IAB Ectopic Multiple Live Births  0 0 0 0 0    # Outcome Date GA Lbr Len/2nd Weight Sex Delivery Anes PTL Lv  1 Current             Past Medical History:  Diagnosis Date  . Cholelithiasis 12/04/2020   Past Surgical History:  Procedure Laterality Date  . WRIST SURGERY     History reviewed. No pertinent family history. Social History   Tobacco Use  . Smoking status: Never Smoker  . Smokeless tobacco: Never Used  Substance Use Topics  . Alcohol use: Yes  . Drug use: Not Currently   No Known Allergies Current Outpatient Medications on File Prior to Visit  Medication Sig Dispense Refill  . Prenatal Vit-Fe Fumarate-FA (PRENATAL MULTIVITAMIN) TABS tablet Take 1 tablet by mouth daily at 12 noon.     No current facility-administered medications on file prior to visit.    Review of Systems Pertinent items noted in HPI and remainder of comprehensive ROS otherwise negative.  Physical Exam:   Vitals:   01/19/21 1435 01/19/21 1440  BP: (!) 145/93 (!) 154/104  Pulse: (!) 123 (!) 128  Weight: (!) 334 lb 11.2 oz (151.8 kg)   BP on 12/18/2020 in ED 146/82   Bedside Ultrasound for FHR check: Viable intrauterine pregnancy with positive cardiac activity noted, fetal heart rate 150 bpm Patient informed that the ultrasound is considered a limited obstetric ultrasound and is not intended to be a complete ultrasound exam.  Patient also informed that the ultrasound is not being completed with the intent of assessing for fetal or  placental anomalies or any pelvic abnormalities.  Explained that the purpose of today's ultrasound is to assess for fetal heart rate.  Patient acknowledges the purpose of the exam and the limitations of the study. General: well-developed, well-nourished female in no acute distress  Breasts:  normal appearance, no masses or tenderness bilaterally  Skin: normal coloration and turgor, no rashes  Neurologic: oriented, normal, negative, normal mood  Extremities: normal strength, tone, and muscle mass, ROM of all joints is normal  HEENT PERRLA, extraocular movement intact and sclera clear, anicteric  Neck supple and no masses  Cardiovascular: regular rate and rhythm  Respiratory:  no respiratory distress, normal breath sounds  Abdomen: soft, obese, non-tender; bowel sounds normal; no masses,  no organomegaly  Pelvic: normal external genitalia, no lesions, normal vaginal mucosa, normal vaginal discharge, normal cervix, pap smear done.     Assessment:    Pregnancy: G1P0 Patient Active Problem List   Diagnosis Date Noted  . Supervision of high-risk pregnancy 01/19/2021  . Maternal morbid obesity, antepartum (HCC) 01/19/2021  . Chronic hypertension complicating or reason for care during pregnancy 01/19/2021     Plan:    1. Chronic hypertension complicating or reason for care during pregnancy, first trimester Meets criteria for [facility], had elevated BP on 12/18/2020 and today [x]  Baseline labs (CBC, CMP, urine Pr:Cr) [x]  Aspirin 81 mg daily prescribed after 12 weeks [ ]  Serial growth  scans 20-24-28-32-35-38 [ ]  Antenatal testing starting at 32 weeks [ ]  Delivery by 39 weeks (with meds, 40 with no meds) or earlier if needed Discussed implications of CHTN in pregnancy, need for antenatal testing and frequent ultrasounds/prenatal visits, need for optimizing BP control to decrease CHTN/preeclampsia associated maternal-fetal morbidity and mortality. Will check baseline labs today. Procardia XL ordered for  treatment. - aspirin EC 81 MG tablet; Take 1 tablet (81 mg total) by mouth daily. Take after 12 weeks for prevention of preeclampsia later in pregnancy  Dispense: 300 tablet; Refill: 2 - Protein / creatinine ratio, urine - NIFEdipine (PROCARDIA-XL/NIFEDICAL-XL) 30 MG 24 hr tablet; Take 1 tablet (30 mg total) by mouth daily.  Dispense: 30 tablet; Refill: 2  2. Maternal morbid obesity, antepartum (HCC) TWG 11-20 lbs recommended. Labs checked today. - Hemoglobin A1c - Comprehensive metabolic panel - TSH - MFM OB DETAIL +14 WK; Future - MFM OB 11-14 WEEK ANATOMY; Future - aspirin EC 81 MG tablet; Take 1 tablet (81 mg total) by mouth daily. Take after 12 weeks for prevention of preeclampsia later in pregnancy  Dispense: 300 tablet; Refill: 2 - Protein / creatinine ratio, urine  3. [redacted] weeks gestation of pregnancy 4. Supervision of high risk pregnancy in first trimester - CBC/D/Plt+RPR+Rh+ABO+Rub Ab... - CHL AMB BABYSCRIPTS SCHEDULE OPTIMIZATION - Culture, OB Urine - Genetic Screening - Hemoglobin A1c - Cytology - PAP( [place]) Initial labs drawn. Continue prenatal vitamins. Problem list reviewed and updated. Genetic Screening discussed, First trimester anatomy scan and NIPS: ordered. Ultrasound discussed; fetal anatomic survey: ordered. Anticipatory guidance about prenatal visits given including labs, ultrasounds, and testing. Discussed usage of Babyscripts and virtual visits as additional source of managing and completing prenatal visits in midst of coronavirus and pandemic.   Encouraged to complete MyChart Registration for her ability to review results, send requests, and have questions addressed.  The nature of [place] [facility] with multiple MDs and Advanced Practice Providers was explained to patient; also emphasized that residents, students are part of our team. Routine obstetric precautions reviewed. Encouraged to seek out care at  office or emergency room [facility] preferred) for urgent and/or emergent concerns. Return in about 4 weeks (around 02/16/2021) for OFFICE OB VISIT (MD only).     [facility], MD, FACOG Obstetrician & Gynecologist, [facility] for Lauren Morales, [facility]

## 2021-01-20 ENCOUNTER — Telehealth: Payer: Self-pay

## 2021-01-20 ENCOUNTER — Encounter: Payer: Self-pay | Admitting: Obstetrics & Gynecology

## 2021-01-20 DIAGNOSIS — D509 Iron deficiency anemia, unspecified: Secondary | ICD-10-CM | POA: Insufficient documentation

## 2021-01-20 DIAGNOSIS — O99011 Anemia complicating pregnancy, first trimester: Secondary | ICD-10-CM | POA: Insufficient documentation

## 2021-01-20 LAB — CBC/D/PLT+RPR+RH+ABO+RUB AB...
Antibody Screen: NEGATIVE
Basophils Absolute: 0.1 10*3/uL (ref 0.0–0.2)
Basos: 0 %
EOS (ABSOLUTE): 0.1 10*3/uL (ref 0.0–0.4)
Eos: 1 %
HCV Ab: <0.1 s/co ratio (ref 0.0–0.9)
HIV Screen 4th Generation wRfx: NONREACTIVE
Hematocrit: 33 % — ABNORMAL LOW (ref 34.0–46.6)
Hemoglobin: 9.7 g/dL — ABNORMAL LOW (ref 11.1–15.9)
Hepatitis B Surface Ag: NEGATIVE
Immature Grans (Abs): 0.1 10*3/uL (ref 0.0–0.1)
Immature Granulocytes: 1 %
Lymphocytes Absolute: 2.7 10*3/uL (ref 0.7–3.1)
Lymphs: 22 %
MCH: 18.8 pg — ABNORMAL LOW (ref 26.6–33.0)
MCHC: 29.4 g/dL — ABNORMAL LOW (ref 31.5–35.7)
MCV: 64 fL — ABNORMAL LOW (ref 79–97)
Monocytes Absolute: 1.2 10*3/uL — ABNORMAL HIGH (ref 0.1–0.9)
Monocytes: 9 %
Neutrophils Absolute: 8.4 10*3/uL — ABNORMAL HIGH (ref 1.4–7.0)
Neutrophils: 67 %
Platelets: 586 10*3/uL — ABNORMAL HIGH (ref 150–450)
RBC: 5.17 x10E6/uL (ref 3.77–5.28)
RDW: 18.3 % — ABNORMAL HIGH (ref 11.7–15.4)
RPR Ser Ql: NONREACTIVE
Rh Factor: POSITIVE
Rubella Antibodies, IGG: 3.59 index (ref >0.99)
WBC: 12.5 10*3/uL — ABNORMAL HIGH (ref 3.4–10.8)

## 2021-01-20 LAB — COMPREHENSIVE METABOLIC PANEL
ALT: 17 IU/L (ref 0–32)
AST: 21 IU/L (ref 0–40)
Albumin/Globulin Ratio: 1.1 — ABNORMAL LOW (ref 1.2–2.2)
Albumin: 3.8 g/dL — ABNORMAL LOW (ref 3.9–5.0)
Alkaline Phosphatase: 102 IU/L (ref 44–121)
BUN/Creatinine Ratio: 11 (ref 9–23)
BUN: 9 mg/dL (ref 6–20)
Bilirubin Total: 0.3 mg/dL (ref 0.0–1.2)
CO2: 20 mmol/L (ref 20–29)
Calcium: 9.9 mg/dL (ref 8.7–10.2)
Chloride: 101 mmol/L (ref 96–106)
Creatinine, Ser: 0.79 mg/dL (ref 0.57–1.00)
GFR calc Af Amer: 121 mL/min/{1.73_m2} (ref >59)
GFR calc non Af Amer: 105 mL/min/{1.73_m2} (ref >59)
Globulin, Total: 3.6 g/dL (ref 1.5–4.5)
Glucose: 85 mg/dL (ref 65–99)
Potassium: 4.4 mmol/L (ref 3.5–5.2)
Sodium: 135 mmol/L (ref 134–144)
Total Protein: 7.4 g/dL (ref 6.0–8.5)

## 2021-01-20 LAB — CYTOLOGY - PAP
Chlamydia: NEGATIVE
Comment: NEGATIVE
Comment: NORMAL
Diagnosis: NEGATIVE
Neisseria Gonorrhea: NEGATIVE

## 2021-01-20 LAB — HEMOGLOBIN A1C
Est. average glucose Bld gHb Est-mCnc: 120 mg/dL
Hgb A1c MFr Bld: 5.8 % — ABNORMAL HIGH (ref 4.8–5.6)

## 2021-01-20 LAB — HCV INTERPRETATION

## 2021-01-20 LAB — PROTEIN / CREATININE RATIO, URINE
Creatinine, Urine: 382.2 mg/dL
Protein, Ur: 22.8 mg/dL
Protein/Creat Ratio: 60 mg/g creat (ref 0–200)

## 2021-01-20 LAB — TSH: TSH: 1.3 u[IU]/mL (ref 0.450–4.500)

## 2021-01-20 NOTE — Addendum Note (Signed)
Addended by: Jaynie Collins A on: 01/20/2021 01:09 PM   Modules accepted: Orders

## 2021-01-20 NOTE — Telephone Encounter (Addendum)
-----   Message from Tereso Newcomer, MD sent at 01/20/2021  1:19 PM EST ----- Has significant iron deficiency anemia in first trimester, recommend Venofer x 3 doses (order signed and held).    Please call to inform patient of results and recommendations. Also prediabetic A1C, early 2 hr GTT recommended during next visit (or earlier). Please call to inform patient of results and recommendations.   Venofer scheduled at [facility] Short Stay on 01/28/21 @ 1000.  Left message for patient that I am calling with results and appts if she could please give me a call back.  MyChart message sent.    Addison Naegeli, RN

## 2021-01-20 NOTE — Addendum Note (Signed)
Addended by: Jaynie Collins A on: 01/20/2021 01:30 PM   Modules accepted: Orders

## 2021-01-21 ENCOUNTER — Telehealth: Payer: Self-pay | Admitting: Family Medicine

## 2021-01-21 ENCOUNTER — Other Ambulatory Visit: Payer: Self-pay

## 2021-01-21 DIAGNOSIS — F419 Anxiety disorder, unspecified: Secondary | ICD-10-CM

## 2021-01-21 LAB — SPECIMEN STATUS REPORT

## 2021-01-21 LAB — FERRITIN: Ferritin: 8 ng/mL — ABNORMAL LOW (ref 15–150)

## 2021-01-21 NOTE — Telephone Encounter (Signed)
Hello, this pt wants to know if you can call her back to see if there are any other options other than infusion

## 2021-01-21 NOTE — Progress Notes (Signed)
amb  

## 2021-01-22 LAB — CULTURE, OB URINE

## 2021-01-22 LAB — URINE CULTURE, OB REFLEX

## 2021-01-26 ENCOUNTER — Telehealth: Payer: Self-pay

## 2021-01-26 NOTE — Telephone Encounter (Signed)
Created GFE's for the patients next two appointments - sent via MyChart.

## 2021-01-27 NOTE — Discharge Instructions (Signed)

## 2021-01-28 ENCOUNTER — Encounter (HOSPITAL_COMMUNITY): Payer: Self-pay

## 2021-01-28 ENCOUNTER — Inpatient Hospital Stay (HOSPITAL_COMMUNITY)
Admission: RE | Admit: 2021-01-28 | Discharge: 2021-01-28 | Disposition: A | Discharge status: Home / Self Care | Payer: Self-pay | Source: Ambulatory Visit | Attending: Obstetrics & Gynecology | Admitting: Obstetrics & Gynecology

## 2021-01-28 NOTE — BH Specialist Note (Signed)
Pt did not arrive to video visit and did not answer the phone ; Voicemail is full, so unable to leave voice message; left MyChart message for patient.   

## 2021-01-30 ENCOUNTER — Telehealth: Payer: Self-pay | Admitting: Family Medicine

## 2021-01-30 ENCOUNTER — Other Ambulatory Visit: Payer: Self-pay | Admitting: Family Medicine

## 2021-01-30 NOTE — Telephone Encounter (Signed)
Returned pt's call and I informed her that she should get the iron infusion as it gets her levels back to normal much faster than taking the pil that can cause her to be constipated leading to abdominal pain.  Pt reports that she does not know that she will be able to do the appointment.  I asked pt if she wants to get the number to schedule her appt.  Pt stated yes. Pt provided with Short Stay [phone] to reschedule her appt. I informed pt that we will f/u with her in regards to her appt.  Pt verbalized understanding with no further questions.   Leonette Nutting  01/30/21

## 2021-01-30 NOTE — Telephone Encounter (Signed)
Pt states she needs to speak with nurse concerning some past test results and other question.

## 2021-01-30 NOTE — Telephone Encounter (Signed)
Pt concern addressed in another encounter.

## 2021-02-03 ENCOUNTER — Other Ambulatory Visit: Payer: Self-pay

## 2021-02-03 ENCOUNTER — Ambulatory Visit: Payer: Medicaid Other | Admitting: *Deleted

## 2021-02-03 ENCOUNTER — Encounter: Payer: Self-pay | Admitting: *Deleted

## 2021-02-03 ENCOUNTER — Ambulatory Visit (INDEPENDENT_AMBULATORY_CARE_PROVIDER_SITE_OTHER): Payer: Medicaid Other | Admitting: Lactation Services

## 2021-02-03 ENCOUNTER — Ambulatory Visit: Payer: Medicaid Other | Attending: Obstetrics & Gynecology

## 2021-02-03 DIAGNOSIS — D509 Iron deficiency anemia, unspecified: Secondary | ICD-10-CM

## 2021-02-03 DIAGNOSIS — O99211 Obesity complicating pregnancy, first trimester: Secondary | ICD-10-CM

## 2021-02-03 DIAGNOSIS — Z3A12 12 weeks gestation of pregnancy: Secondary | ICD-10-CM

## 2021-02-03 DIAGNOSIS — O99210 Obesity complicating pregnancy, unspecified trimester: Secondary | ICD-10-CM | POA: Insufficient documentation

## 2021-02-03 DIAGNOSIS — O10011 Pre-existing essential hypertension complicating pregnancy, first trimester: Secondary | ICD-10-CM

## 2021-02-03 DIAGNOSIS — O10912 Unspecified pre-existing hypertension complicating pregnancy, second trimester: Secondary | ICD-10-CM

## 2021-02-03 DIAGNOSIS — O99011 Anemia complicating pregnancy, first trimester: Secondary | ICD-10-CM | POA: Diagnosis present

## 2021-02-03 NOTE — Progress Notes (Signed)
Patient came to office after being in MFM to pick up BP cuff. Patient has Pregnancy Medicaid so does not qualify for cuff.   Baby Scripts cuff given. Does not fit patients upper arm. Patient was taught to place on lower arm just below antecubital area. BP read 144/114 with HR 108. Then applied Large cuff from office and could not read BP. Large cuff them placed on lower arm and BP read 152/90 with HR 118. She denies and HA, Blurred Vison or dizziness. Reviewed sitting for 15 minutes prior to taking and to keep feet flat on the floor while taking. Patient active with Baby Scripts, asked her to enter BP in Baby Scripts once a week. Reviewed that if abnormal, Baby Scripts   Patient reports she is taking Nifedipine 30 mg QD and was informed by MFM today to increase to 60 mg QD. Reviewed it is important to increase the medication to get BP in more normal range.   Patient did not go for Venofer infusion as she was unsure of how important it is. Reviewed that her Iron levels and Hemoglobin are very low and that Venofer infusions x 3 is indicated to increase Hemoglobin for mom and baby's sake. Number to short stay unit give and patient will call and reschedule Venofer infusion.   Patient has follow up appt at [facility] on 3/7. Infant to follow up with MFM on 4/11. Patient to call with any questions or concerns as needed.

## 2021-02-04 ENCOUNTER — Ambulatory Visit (HOSPITAL_COMMUNITY): Payer: Self-pay

## 2021-02-11 ENCOUNTER — Ambulatory Visit: Payer: Medicaid Other | Admitting: Clinical

## 2021-02-11 DIAGNOSIS — Z5329 Procedure and treatment not carried out because of patient's decision for other reasons: Secondary | ICD-10-CM

## 2021-02-11 DIAGNOSIS — Z91199 Patient's noncompliance with other medical treatment and regimen due to unspecified reason: Secondary | ICD-10-CM

## 2021-02-16 ENCOUNTER — Ambulatory Visit (INDEPENDENT_AMBULATORY_CARE_PROVIDER_SITE_OTHER): Payer: Medicaid Other | Admitting: Obstetrics and Gynecology

## 2021-02-16 ENCOUNTER — Other Ambulatory Visit: Payer: Self-pay

## 2021-02-16 ENCOUNTER — Encounter: Payer: Self-pay | Admitting: Obstetrics and Gynecology

## 2021-02-16 VITALS — BP 147/87 | HR 84 | Wt 337.2 lb

## 2021-02-16 DIAGNOSIS — D509 Iron deficiency anemia, unspecified: Secondary | ICD-10-CM

## 2021-02-16 DIAGNOSIS — O99011 Anemia complicating pregnancy, first trimester: Secondary | ICD-10-CM

## 2021-02-16 DIAGNOSIS — O99210 Obesity complicating pregnancy, unspecified trimester: Secondary | ICD-10-CM

## 2021-02-16 DIAGNOSIS — O10912 Unspecified pre-existing hypertension complicating pregnancy, second trimester: Secondary | ICD-10-CM

## 2021-02-16 DIAGNOSIS — O0992 Supervision of high risk pregnancy, unspecified, second trimester: Secondary | ICD-10-CM

## 2021-02-16 NOTE — Progress Notes (Signed)
   PRENATAL VISIT NOTE  Subjective:  Lauren Morales is a 25 y.o. G1P0 at [redacted]w[redacted]d being seen today for ongoing prenatal care.  She is currently monitored for the following issues for this high-risk pregnancy and has Supervision of high-risk pregnancy; Maternal morbid obesity, antepartum (HCC); Chronic hypertension complicating or reason for care during pregnancy; and Maternal iron deficiency anemia affecting pregnancy, antepartum, first trimester on their problem list.  Patient reports no complaints.  Contractions: Not present. Vag. Bleeding: None.  Movement: Absent. Denies leaking of fluid.   The following portions of the patient's history were reviewed and updated as appropriate: allergies, current medications, past family history, past medical history, past social history, past surgical history and problem list.   Objective:   Vitals:   02/16/21 1630  BP: (!) 147/87  Pulse: 84  Weight: (!) 337 lb 3.2 oz (153 kg)    Fetal Status:     Movement: Absent     General:  Alert, oriented and cooperative. Patient is in no acute distress.  Skin: Skin is warm and dry. No rash noted.   Cardiovascular: Normal heart rate noted  Respiratory: Normal respiratory effort, no problems with respiration noted  Abdomen: Soft, gravid, appropriate for gestational age.  Pain/Pressure: Absent     Pelvic: Cervical exam deferred        Extremities: Normal range of motion.  Edema: None  Mental Status: Normal mood and affect. Normal behavior. Normal judgment and thought content.   Assessment and Plan:  Pregnancy: G1P0 at [redacted]w[redacted]d 1. Supervision of high risk pregnancy in second trimester Patient is doing well without complaints Panorama today AFP next visit Pt informed that the ultrasound is considered a limited OB ultrasound and is not intended to be a complete ultrasound exam.  Patient also informed that the ultrasound is not being completed with the intent of assessing for fetal or placental anomalies or any  pelvic abnormalities.  Explained that the purpose of today's ultrasound is to assess for  viability. + fetal heart activity visualized. Patient acknowledges the purpose of the exam and the limitations of the study.      2. Maternal chronic hypertension in second trimester Continue labetalol Continue ASA  3. Maternal iron deficiency anemia affecting pregnancy, antepartum, first trimester Patient did not show for Venofer infusion- Will reschedule for the patient  4. Maternal morbid obesity, antepartum (HCC)   Preterm labor symptoms and general obstetric precautions including but not limited to vaginal bleeding, contractions, leaking of fluid and fetal movement were reviewed in detail with the patient. Please refer to After Visit Summary for other counseling recommendations.   Return in about 4 weeks (around 03/16/2021) for in person, ROB, High risk.  Future Appointments  Date Time Provider Department Center  03/23/2021  2:00 PM [facility] NURSE [facility]  03/23/2021  2:15 PM [facility] US2 [facility]    Catalina Antigua, MD

## 2021-02-17 ENCOUNTER — Telehealth: Payer: Self-pay

## 2021-02-17 NOTE — Telephone Encounter (Addendum)
Pt scheduled for Iron infusion (Venofer 300 mg) appt on 02/25/21 at 0900. Pt called; VM left with appt details. MyChart message sent.

## 2021-02-18 ENCOUNTER — Encounter: Payer: Self-pay | Admitting: *Deleted

## 2021-02-23 ENCOUNTER — Telehealth: Payer: Self-pay

## 2021-02-23 NOTE — Telephone Encounter (Addendum)
-----   Message from Tereso Newcomer, MD sent at 02/19/2021 10:16 AM EST ----- Panorama was insufficient, recommend redraw sometime next week as she will be over 15 weeks and AFP screen can also be done at same time.  Negative [facility].  For some reason the 27 disease panel was checked instead of 4, can we make sure the Basic 4 panel is the one checked for NOBs and not the 27? Thank you.  Please call to inform patient of results and recommendations.  Jaynie Collins, MD    Called pt with results, VM left with callback number. MyChart message sent.

## 2021-02-25 ENCOUNTER — Other Ambulatory Visit: Payer: Self-pay

## 2021-02-25 ENCOUNTER — Ambulatory Visit (HOSPITAL_COMMUNITY)
Admission: RE | Admit: 2021-02-25 | Discharge: 2021-02-25 | Disposition: A | Discharge status: Home / Self Care | Payer: Medicaid Other | Source: Ambulatory Visit | Attending: Obstetrics & Gynecology | Admitting: Obstetrics & Gynecology

## 2021-02-25 DIAGNOSIS — O99011 Anemia complicating pregnancy, first trimester: Secondary | ICD-10-CM | POA: Insufficient documentation

## 2021-02-25 DIAGNOSIS — D509 Iron deficiency anemia, unspecified: Secondary | ICD-10-CM | POA: Diagnosis present

## 2021-02-25 MED ORDER — SODIUM CHLORIDE 0.9 % IV SOLN
300.0000 mg | INTRAVENOUS | Status: DC
  Administered 2021-02-25: 300 mg via INTRAVENOUS
  Filled 2021-02-25: qty 15

## 2021-02-25 NOTE — Discharge Instructions (Signed)

## 2021-03-04 ENCOUNTER — Inpatient Hospital Stay (HOSPITAL_COMMUNITY)
Admission: RE | Admit: 2021-03-04 | Discharge: 2021-03-04 | Disposition: A | Discharge status: Home / Self Care | Payer: Medicaid Other | Source: Ambulatory Visit | Attending: Obstetrics & Gynecology | Admitting: Obstetrics & Gynecology

## 2021-03-04 NOTE — Progress Notes (Signed)
Pt was a no show for Venofer infusion today at [facility]

## 2021-03-05 ENCOUNTER — Telehealth: Payer: Self-pay | Admitting: General Practice

## 2021-03-05 ENCOUNTER — Encounter: Payer: Self-pay | Admitting: General Practice

## 2021-03-05 NOTE — Telephone Encounter (Signed)
Called [employer] Counselor to discuss patient's panorama results- was informed first specimen in February could not be ran due to the sample not meeting laboratory quality metrics. The March specimen could not produce a result due to low fetal fraction. Patient could have third sample drawn but they could not determine a percent chance that a result will be produced this time. They also stated the patient could have a quad screen as well if she would prefer as an alternative. Called patient and discussed results. Also discussed panorama versus quad screen and explained the differences. Patient verbalized understanding & would like quad screen drawn instead at next visit. appt note made

## 2021-03-11 ENCOUNTER — Encounter (HOSPITAL_COMMUNITY): Payer: Medicaid Other

## 2021-03-16 ENCOUNTER — Other Ambulatory Visit: Payer: Self-pay

## 2021-03-16 ENCOUNTER — Ambulatory Visit (INDEPENDENT_AMBULATORY_CARE_PROVIDER_SITE_OTHER): Payer: Medicaid Other | Admitting: Obstetrics and Gynecology

## 2021-03-16 VITALS — BP 126/88 | HR 114 | Wt 337.1 lb

## 2021-03-16 DIAGNOSIS — Z3A18 18 weeks gestation of pregnancy: Secondary | ICD-10-CM | POA: Insufficient documentation

## 2021-03-16 DIAGNOSIS — O10911 Unspecified pre-existing hypertension complicating pregnancy, first trimester: Secondary | ICD-10-CM

## 2021-03-16 DIAGNOSIS — Z6841 Body Mass Index (BMI) 40.0 and over, adult: Secondary | ICD-10-CM | POA: Insufficient documentation

## 2021-03-16 DIAGNOSIS — O99210 Obesity complicating pregnancy, unspecified trimester: Secondary | ICD-10-CM

## 2021-03-16 DIAGNOSIS — O10912 Unspecified pre-existing hypertension complicating pregnancy, second trimester: Secondary | ICD-10-CM

## 2021-03-16 DIAGNOSIS — O0992 Supervision of high risk pregnancy, unspecified, second trimester: Secondary | ICD-10-CM

## 2021-03-16 LAB — POCT URINALYSIS DIP (DEVICE)
Glucose, UA: NEGATIVE mg/dL
Hgb urine dipstick: NEGATIVE
Nitrite: NEGATIVE
Protein, ur: NEGATIVE mg/dL
Specific Gravity, Urine: >=1.03 (ref 1.005–1.030)
Urobilinogen, UA: 1 mg/dL (ref 0.0–1.0)
pH: 6 (ref 5.0–8.0)

## 2021-03-16 MED ORDER — NIFEDIPINE ER OSMOTIC RELEASE 60 MG PO TB24: 60.0000 mg | ORAL_TABLET | Freq: Every day | ORAL | 4 refills | Status: DC

## 2021-03-16 NOTE — Patient Instructions (Signed)
Nifedipine Extended-Release Oral Tablets What is this medicine? NIFEDIPINE (nye FED i peen) is a calcium channel blocker. It relaxes your blood vessels and decreases the amount of work the heart has to do. It treats high blood pressure and/or prevents chest pain (also called angina). This medicine may be used for other purposes; ask your health care provider or pharmacist if you have questions. COMMON BRAND NAME(S): Adalat CC, Afeditab CR, Nifediac CC, Nifedical XL, Procardia XL What should I tell my health care provider before I take this medicine? They need to know if you have any of these conditions:  blockage in your bowels  constipation  heart attack  heart disease  heart failure  liver disease  low blood pressure  an unusual or allergic reaction to nifedipine, other drugs, foods, dyes or preservatives  pregnant or trying to get pregnant  breast-feeding How should I use this medicine? Take this drug by mouth. Take it as directed on the prescription label at the same time every day. Do not cut, crush or chew this drug. Swallow the tablets whole. Some tablets need to be taken on an empty stomach. Ask your pharmacist or health care provider if you have any questions. Keep taking it unless your health care provider tells you to stop. Do not take this drug with grapefruit juice. Talk to your health care provider about the use of this drug in children. Special care may be needed. Overdosage: If you think you have taken too much of this medicine contact a poison control center or emergency room at once. NOTE: This medicine is only for you. Do not share this medicine with others. What if I miss a dose? If you miss a dose, take it as soon as you can. If it is almost time for your next dose, take only that dose. Do not take double or extra doses. What may interact with this medicine? Do not take this medicine with any of the following medications:  certain medicines for seizures like  carbamazepine, phenobarbital, phenytoin  lumacaftor; ivacaftor  rifabutin  rifampin  rifapentine  St. John's Wort This medicine may also interact with the following medications:  antiviral medicines for HIV or AIDS  certain medicines for blood pressure  certain medicines for diabetes  certain medicines for erectile dysfunction  certain medicines for fungal infections like ketoconazole, fluconazole, and itraconazole  certain medicines for irregular heart beat like flecainide and quinidine  certain medicines that treat or prevent blood clots like warfarin  clarithromycin  digoxin  dolasetron  erythromycin  fluoxetine  grapefruit juice  local or general anesthetics  nefazodone  orlistat  quinupristin; dalfopristin  sirolimus  stomach acid blockers like cimetidine, ranitidine, omeprazole, or pantoprazole  tacrolimus  valproic acid This list may not describe all possible interactions. Give your health care provider a list of all the medicines, herbs, non-prescription drugs, or dietary supplements you use. Also tell them if you smoke, drink alcohol, or use illegal drugs. Some items may interact with your medicine. What should I watch for while using this medicine? Visit your health care provider for regular checks on your progress. Check your blood pressure as directed. Ask your health care provider what your blood pressure should be. Also, find out when you should contact him or her. Do not treat yourself for coughs, colds, or pain while you are using this drug without asking your health care provider for advice. Some drugs may increase your blood pressure. You may get drowsy or dizzy. Do not drive,  use machinery, or do anything that needs mental alertness until you know how this drug affects you. Do not stand up or sit up quickly, especially if you are an older patient. This reduces the risk of dizzy or fainting spells. The tablet shell for some brands of this  drug does not dissolve. This is normal. The tablet shell may appear whole in the stool. This is not a cause for concern. What side effects may I notice from receiving this medicine? Side effects that you should report to your doctor or health care provider as soon as possible:  allergic reactions (skin rash, itching or hives; swelling of the face, lips, or tongue)  heart attack (trouble breathing; pain or tightness in the chest, neck, back or arms; unusually weak or tired)  heart failure (trouble breathing; fast, irregular heartbeat; sudden weight gain; swelling of the ankles, feet, hands; unusually weak or tired)  low blood pressure (dizziness; feeling faint or lightheaded, falls; unusually weak or tired) Side effects that usually do not require medical attention (report to your doctor or health care provider if they continue or are bothersome):  bloating  changes in emotions or moods  constipation  facial flushing  headache  nasal congestion (like runny or stuffy nose)  nausea  stomach pain This list may not describe all possible side effects. Call your doctor for medical advice about side effects. You may report side effects to FDA at 1-800-FDA-1088. Where should I keep my medicine? Keep out of the reach of children and pets. Store at room temperature between 20 and 25 degrees C (68 and 77 degrees F). Protect from light and moisture. Keep the container tightly closed. Throw away any unused drug after the expiration date. NOTE: This sheet is a summary. It may not cover all possible information. If you have questions about this medicine, talk to your doctor, pharmacist, or health care provider.  2021 Elsevier/Gold Standard (2019-09-25 08:24:11) Hypertension During Pregnancy Hypertension is also called high blood pressure. High blood pressure means that the force of the blood moving in your body is high enough to cause problems for you and your baby. Different types of high blood  pressure can happen during pregnancy. The types are:  High blood pressure before you got pregnant. This is called chronic hypertension.  This can continue during your pregnancy. Your doctor will want to keep checking your blood pressure. You may need medicine to control your blood pressure while you are pregnant. You will need follow-up visits after you have your baby.  High blood pressure that goes up during pregnancy when it was normal before. This is called gestational hypertension. It will often get better after you have your baby, but your doctor will need to watch your blood pressure to make sure that it is getting better.  You may develop high blood pressure after giving birth. This is called postpartum hypertension. This often occurs within 48 hours after childbirth but may occur up to 6 weeks after giving birth. Very high blood pressure during pregnancy is an emergency that needs treatment right away. How does this affect me? If you have high blood pressure during pregnancy, you have a higher chance of developing high blood pressure:  As you get older.  If you get pregnant again. In some cases, high blood pressure during pregnancy can cause:  Stroke.  Heart attack.  Damage to the kidneys, lungs, or liver.  Preeclampsia.  HELLP syndrome.  Seizures.  Problems with the placenta. How does this affect  my baby? Your baby may:  Be born early.  Not weigh as much as he or she should.  Not handle labor well, leading to a C-section. This condition may also result in a baby's death before birth (stillbirth). What are the risks?  Having high blood pressure during a past pregnancy.  Being overweight.  Being age 25 or older.  Being pregnant for the first time.  Being pregnant with more than one baby.  Becoming pregnant using fertility methods, such as IVF.  Having other problems, such as diabetes or kidney disease. What can I do to lower my risk?  Keep a healthy  weight.  Eat a healthy diet.  Follow what your doctor tells you about treating any medical problems that you had before you got pregnant. It is very important to go to all of your doctor visits. Your doctor will check your blood pressure and make sure that your pregnancy is progressing as it should. Treatment should start early if a problem is found.   How is this treated? Treatment for high blood pressure during pregnancy can vary. It depends on the type of high blood pressure you have and how serious it is.  If you were taking medicine for your blood pressure before you got pregnant, talk with your doctor. You may need to change the medicine during pregnancy if it is not safe for your baby.  If your blood pressure goes up during pregnancy, your doctor may order medicine to treat this.  If you are at risk for preeclampsia, your doctor may tell you to take a low-dose aspirin while you are pregnant.  If you have very high blood pressure, you may need to stay in the hospital so you and your baby can be watched closely. You may also need to take medicine to lower your blood pressure.  In some cases, if your condition gets worse, you may need to have your baby early. Follow these instructions at home: Eating and drinking  Drink enough fluid to keep your pee (urine) pale yellow.  Avoid caffeine.   Lifestyle  Do not smoke or use any products that contain nicotine or tobacco. If you need help quitting, ask your doctor.  Do not use alcohol or drugs.  Avoid stress.  Rest and get plenty of sleep.  Regular exercise can help. Ask your doctor what kinds of exercise are best for you. General instructions  Take over-the-counter and prescription medicines only as told by your doctor.  Keep all prenatal and follow-up visits. Contact a doctor if:  You have symptoms that your doctor told you to watch for, such as: ? Headaches. ? A feeling like you may vomit (nausea). ? Vomiting. ? Belly  (abdominal) pain. ? Feeling dizzy or light-headed. Get help right away if:  You have symptoms of serious problems, such as: ? Very bad belly pain that does not get better with treatment. ? A very bad headache that does not get better. ? Blurry vision. ? Double vision. ? Vomiting that does not get better. ? Sudden, fast weight gain. ? Sudden swelling in your hands, ankles, or face. ? Bleeding from your vagina. ? Blood in your pee. ? Shortness of breath. ? Chest pain. ? Weakness on one side of your body. ? Trouble talking.  Your baby is not moving as much as usual. These symptoms may be an emergency. Get help right away. Call your local emergency services (911 in the [place]  Do not wait to see if the  symptoms will go away.  Do not drive yourself to the hospital. Summary  High blood pressure is also called hypertension.  High blood pressure means that the force of the blood moving in your body is high enough to cause problems for you and your baby.  Get help right away if you have symptoms of serious problems due to high blood pressure.  Keep all prenatal and follow-up visits. This information is not intended to replace advice given to you by your health care provider. Make sure you discuss any questions you have with your health care provider. Document Revised: 08/21/2020 Document Reviewed: 08/21/2020 Elsevier Patient Education  2021 [employer].

## 2021-03-16 NOTE — Progress Notes (Signed)
Needs Rx Nifedipine refills. Pt states taking 60 mg of BP med daily.  Pt denies seeing spots, visual changes or headaches with elevated BP reading today.

## 2021-03-16 NOTE — Progress Notes (Signed)
   PRENATAL VISIT NOTE  Subjective:  Lauren Morales is a 25 y.o. G1P0 at [redacted]w[redacted]d being seen today for ongoing prenatal care.  She is currently monitored for the following issues for this high-risk pregnancy and has Supervision of high-risk pregnancy; Maternal morbid obesity, antepartum (HCC); Chronic hypertension complicating or reason for care during pregnancy; Maternal iron deficiency anemia affecting pregnancy, antepartum, first trimester; [redacted] weeks gestation of pregnancy; and BMI 50.0-59.9, adult (HCC) on their problem list.  Patient doing well with no acute concerns today. She reports no complaints.  Contractions: Not present. Vag. Bleeding: None.  Movement: Present. Denies leaking of fluid.   The following portions of the patient's history were reviewed and updated as appropriate: allergies, current medications, past family history, past medical history, past social history, past surgical history and problem list. Problem list updated.  Objective:   Vitals:   03/16/21 1514 03/16/21 1544  BP: (!) 131/100 126/88  Pulse: (!) 112 (!) 114  Weight: (!) 337 lb 1.6 oz (152.9 kg)     Fetal Status: Fetal Heart Rate (bpm): 160   Movement: Present     General:  Alert, oriented and cooperative. Patient is in no acute distress.  Skin: Skin is warm and dry. No rash noted.   Cardiovascular: Normal heart rate noted  Respiratory: Normal respiratory effort, no problems with respiration noted  Abdomen: Soft, gravid, appropriate for gestational age.  Pain/Pressure: Absent     Pelvic: Cervical exam deferred        Extremities: Normal range of motion.  Edema: None  Mental Status:  Normal mood and affect. Normal behavior. Normal judgment and thought content.   Assessment and Plan:  Pregnancy: G1P0 at [redacted]w[redacted]d  1. Chronic hypertension complicating or reason for care during pregnancy, second trimester  - NIFEdipine (PROCARDIA XL/NIFEDICAL XL) 60 MG 24 hr tablet; Take 1 tablet (60 mg total) by mouth daily.   Dispense: 30 tablet; Refill: 4  2. Maternal chronic hypertension in second trimester   3. Maternal morbid obesity, antepartum (HCC)   4. Supervision of high risk pregnancy in second trimester Anatomy scan 4/11 - AFP, Serum, Open Spina Bifida - CBC, recheck  Since pt is s/p iron infusion, if still sub 9 for hgb will send order for new iron infusion 5. [redacted] weeks gestation of pregnancy   6. BMI 50.0-59.9, adult (HCC)   Preterm labor symptoms and general obstetric precautions including but not limited to vaginal bleeding, contractions, leaking of fluid and fetal movement were reviewed in detail with the patient.  Please refer to After Visit Summary for other counseling recommendations.   Return in about 3 weeks (around 04/06/2021) for [facility], in person.   Mariel Aloe, MD Faculty Attending [facility]

## 2021-03-17 ENCOUNTER — Encounter: Payer: Self-pay | Admitting: *Deleted

## 2021-03-17 NOTE — Addendum Note (Signed)
Addended by: Isabell Jarvis on: 03/17/2021 08:09 AM   Modules accepted: Orders

## 2021-03-19 ENCOUNTER — Encounter: Payer: Self-pay | Admitting: *Deleted

## 2021-03-23 ENCOUNTER — Other Ambulatory Visit: Payer: Self-pay

## 2021-03-23 ENCOUNTER — Encounter: Payer: Self-pay | Admitting: *Deleted

## 2021-03-23 ENCOUNTER — Telehealth: Payer: Self-pay | Admitting: Family Medicine

## 2021-03-23 ENCOUNTER — Ambulatory Visit: Payer: Medicaid Other | Attending: Obstetrics & Gynecology

## 2021-03-23 ENCOUNTER — Ambulatory Visit: Payer: Medicaid Other | Admitting: *Deleted

## 2021-03-23 DIAGNOSIS — D509 Iron deficiency anemia, unspecified: Secondary | ICD-10-CM | POA: Diagnosis present

## 2021-03-23 DIAGNOSIS — O99011 Anemia complicating pregnancy, first trimester: Secondary | ICD-10-CM | POA: Insufficient documentation

## 2021-03-23 DIAGNOSIS — O99210 Obesity complicating pregnancy, unspecified trimester: Secondary | ICD-10-CM | POA: Diagnosis present

## 2021-03-23 LAB — AFP, SERUM, OPEN SPINA BIFIDA
AFP MoM: 1.92
AFP Value: 63.8 ng/mL
Gest. Age on Collection Date: 18.3 weeks
Maternal Age At EDD: 25.2 yr
OSBR Risk 1 IN: 1930
Test Results:: NEGATIVE
Weight: 337 [lb_av]

## 2021-03-23 LAB — CBC
Hematocrit: 32.8 % — ABNORMAL LOW (ref 34.0–46.6)
Hemoglobin: 10.3 g/dL — ABNORMAL LOW (ref 11.1–15.9)
MCH: 21 pg — ABNORMAL LOW (ref 26.6–33.0)
MCHC: 31.4 g/dL — ABNORMAL LOW (ref 31.5–35.7)
MCV: 67 fL — ABNORMAL LOW (ref 79–97)
Platelets: 496 10*3/uL — ABNORMAL HIGH (ref 150–450)
RBC: 4.9 x10E6/uL (ref 3.77–5.28)
RDW: 23 % — ABNORMAL HIGH (ref 11.7–15.4)
WBC: 11.2 10*3/uL — ABNORMAL HIGH (ref 3.4–10.8)

## 2021-03-24 ENCOUNTER — Other Ambulatory Visit: Payer: Self-pay | Admitting: *Deleted

## 2021-03-24 DIAGNOSIS — O99212 Obesity complicating pregnancy, second trimester: Secondary | ICD-10-CM

## 2021-03-24 DIAGNOSIS — O10012 Pre-existing essential hypertension complicating pregnancy, second trimester: Secondary | ICD-10-CM

## 2021-03-24 DIAGNOSIS — Z3A19 19 weeks gestation of pregnancy: Secondary | ICD-10-CM

## 2021-03-24 DIAGNOSIS — Z363 Encounter for antenatal screening for malformations: Secondary | ICD-10-CM

## 2021-03-24 DIAGNOSIS — Z6841 Body Mass Index (BMI) 40.0 and over, adult: Secondary | ICD-10-CM

## 2021-03-25 ENCOUNTER — Encounter: Payer: Self-pay | Admitting: *Deleted

## 2021-03-25 NOTE — Telephone Encounter (Signed)
error 

## 2021-04-06 ENCOUNTER — Encounter: Payer: Medicaid Other | Admitting: Family Medicine

## 2021-04-15 ENCOUNTER — Ambulatory Visit (INDEPENDENT_AMBULATORY_CARE_PROVIDER_SITE_OTHER): Payer: Medicaid Other | Admitting: Obstetrics & Gynecology

## 2021-04-15 VITALS — BP 144/89 | HR 105 | Wt 332.9 lb

## 2021-04-15 DIAGNOSIS — O99011 Anemia complicating pregnancy, first trimester: Secondary | ICD-10-CM

## 2021-04-15 DIAGNOSIS — D509 Iron deficiency anemia, unspecified: Secondary | ICD-10-CM

## 2021-04-15 MED ORDER — FERROUS SULFATE 325 (65 FE) MG PO TABS: 325.0000 mg | ORAL_TABLET | ORAL | 3 refills | Status: DC

## 2021-04-15 NOTE — Patient Instructions (Signed)

## 2021-04-15 NOTE — Progress Notes (Signed)
   PRENATAL VISIT NOTE  Subjective:  Lauren Morales is a 25 y.o. G1P0 at [redacted]w[redacted]d being seen today for ongoing prenatal care.  She is currently monitored for the following issues for this high-risk pregnancy and has Supervision of high-risk pregnancy; Maternal morbid obesity, antepartum (HCC); Chronic hypertension complicating or reason for care during pregnancy; Maternal iron deficiency anemia affecting pregnancy, antepartum, first trimester; [redacted] weeks gestation of pregnancy; and BMI 50.0-59.9, adult (HCC) on their problem list.  Patient reports no complaints.  Contractions: Not present. Vag. Bleeding: None.  Movement: Present. Denies leaking of fluid.   The following portions of the patient's history were reviewed and updated as appropriate: allergies, current medications, past family history, past medical history, past social history, past surgical history and problem list.   Objective:   Vitals:   04/15/21 1527  BP: (!) 144/89  Pulse: (!) 105  Weight: (!) 332 lb 14.4 oz (151 kg)    Fetal Status: Fetal Heart Rate (bpm): 161   Movement: Present     General:  Alert, oriented and cooperative. Patient is in no acute distress.  Skin: Skin is warm and dry. No rash noted.   Cardiovascular: Normal heart rate noted  Respiratory: Normal respiratory effort, no problems with respiration noted  Abdomen: Soft, gravid, appropriate for gestational age.  Pain/Pressure: Absent     Pelvic: Cervical exam deferred        Extremities: Normal range of motion.  Edema: None  Mental Status: Normal mood and affect. Normal behavior. Normal judgment and thought content.   Assessment and Plan:  Pregnancy: G1P0 at [redacted]w[redacted]d 1. Maternal iron deficiency anemia affecting pregnancy, antepartum, first trimester 10.3 Hb - ferrous sulfate 325 (65 FE) MG tablet; Take 1 tablet (325 mg total) by mouth every other day.  Dispense: 30 tablet; Refill: 3  Preterm labor symptoms and general obstetric precautions including but not  limited to vaginal bleeding, contractions, leaking of fluid and fetal movement were reviewed in detail with the patient. Please refer to After Visit Summary for other counseling recommendations.   Return in about 4 weeks (around 05/13/2021) for 2 hr GTT.  Future Appointments  Date Time Provider Department Center  05/04/2021  3:30 PM [facility] NURSE [facility]  05/04/2021  3:45 PM [facility] US5 [facility]  05/15/2021  8:20 AM [facility] LAB [facility]  05/15/2021  8:55 AM Reva Bores, MD [place] [facility]    Scheryl Darter, MD

## 2021-05-04 ENCOUNTER — Ambulatory Visit: Payer: Medicaid Other

## 2021-05-13 ENCOUNTER — Ambulatory Visit: Payer: Medicaid Other | Attending: Maternal & Fetal Medicine | Admitting: *Deleted

## 2021-05-13 ENCOUNTER — Encounter: Payer: Self-pay | Admitting: *Deleted

## 2021-05-13 ENCOUNTER — Ambulatory Visit (HOSPITAL_BASED_OUTPATIENT_CLINIC_OR_DEPARTMENT_OTHER): Payer: Medicaid Other

## 2021-05-13 ENCOUNTER — Other Ambulatory Visit: Payer: Self-pay

## 2021-05-13 DIAGNOSIS — O10912 Unspecified pre-existing hypertension complicating pregnancy, second trimester: Secondary | ICD-10-CM | POA: Diagnosis not present

## 2021-05-13 DIAGNOSIS — Z79899 Other long term (current) drug therapy: Secondary | ICD-10-CM | POA: Insufficient documentation

## 2021-05-13 DIAGNOSIS — Z3A26 26 weeks gestation of pregnancy: Secondary | ICD-10-CM | POA: Diagnosis not present

## 2021-05-13 DIAGNOSIS — O99011 Anemia complicating pregnancy, first trimester: Secondary | ICD-10-CM

## 2021-05-13 DIAGNOSIS — O99212 Obesity complicating pregnancy, second trimester: Secondary | ICD-10-CM | POA: Insufficient documentation

## 2021-05-13 DIAGNOSIS — O10012 Pre-existing essential hypertension complicating pregnancy, second trimester: Secondary | ICD-10-CM

## 2021-05-13 DIAGNOSIS — Z362 Encounter for other antenatal screening follow-up: Secondary | ICD-10-CM

## 2021-05-13 DIAGNOSIS — D509 Iron deficiency anemia, unspecified: Secondary | ICD-10-CM

## 2021-05-13 DIAGNOSIS — Z6841 Body Mass Index (BMI) 40.0 and over, adult: Secondary | ICD-10-CM

## 2021-05-14 ENCOUNTER — Other Ambulatory Visit: Payer: Self-pay | Admitting: *Deleted

## 2021-05-14 DIAGNOSIS — O10912 Unspecified pre-existing hypertension complicating pregnancy, second trimester: Secondary | ICD-10-CM

## 2021-05-14 DIAGNOSIS — O0990 Supervision of high risk pregnancy, unspecified, unspecified trimester: Secondary | ICD-10-CM

## 2021-05-15 ENCOUNTER — Ambulatory Visit (INDEPENDENT_AMBULATORY_CARE_PROVIDER_SITE_OTHER): Payer: Medicaid Other | Admitting: Family Medicine

## 2021-05-15 ENCOUNTER — Other Ambulatory Visit: Payer: Medicaid Other

## 2021-05-15 ENCOUNTER — Other Ambulatory Visit: Payer: Self-pay

## 2021-05-15 VITALS — BP 131/66 | HR 115 | Wt 327.0 lb

## 2021-05-15 DIAGNOSIS — O99210 Obesity complicating pregnancy, unspecified trimester: Secondary | ICD-10-CM

## 2021-05-15 DIAGNOSIS — O0992 Supervision of high risk pregnancy, unspecified, second trimester: Secondary | ICD-10-CM

## 2021-05-15 DIAGNOSIS — O10912 Unspecified pre-existing hypertension complicating pregnancy, second trimester: Secondary | ICD-10-CM

## 2021-05-15 DIAGNOSIS — O0990 Supervision of high risk pregnancy, unspecified, unspecified trimester: Secondary | ICD-10-CM

## 2021-05-15 NOTE — Progress Notes (Signed)
Completed Pregnancy Screening Risk Form

## 2021-05-15 NOTE — Progress Notes (Signed)
   PRENATAL VISIT NOTE  Subjective:  Lauren Morales is a 25 y.o. G1P0 at [redacted]w[redacted]d being seen today for ongoing prenatal care.  She is currently monitored for the following issues for this high-risk pregnancy and has Supervision of high-risk pregnancy; Maternal morbid obesity, antepartum (HCC); Chronic hypertension complicating or reason for care during pregnancy; Maternal iron deficiency anemia affecting pregnancy, antepartum, first trimester; and BMI 50.0-59.9, adult (HCC) on their problem list.  Patient reports no complaints.  Contractions: Not present. Vag. Bleeding: None.  Movement: Present. Denies leaking of fluid.   The following portions of the patient's history were reviewed and updated as appropriate: allergies, current medications, past family history, past medical history, past social history, past surgical history and problem list.   Objective:   Vitals:   05/15/21 0846  BP: 131/66  Pulse: (!) 115  Weight: (!) 327 lb (148.3 kg)    Fetal Status: Fetal Heart Rate (bpm): 145   Movement: Present     General:  Alert, oriented and cooperative. Patient is in no acute distress.  Skin: Skin is warm and dry. No rash noted.   Cardiovascular: Normal heart rate noted  Respiratory: Normal respiratory effort, no problems with respiration noted  Abdomen: Soft, gravid, appropriate for gestational age.  Pain/Pressure: Absent     Pelvic: Cervical exam deferred        Extremities: Normal range of motion.  Edema: None  Mental Status: Normal mood and affect. Normal behavior. Normal judgment and thought content.   Assessment and Plan:  Pregnancy: G1P0 at [redacted]w[redacted]d 1. Maternal chronic hypertension in second trimester BP ok today on Procardia 60 mg Continue ASA U/S for growth scheduled  2. Supervision of high risk pregnancy in second trimester 28 wk labs today--defer TDaP until next visit  3. Maternal morbid obesity, antepartum (HCC) TWG is -7 lbs thus far  Preterm labor symptoms and general  obstetric precautions including but not limited to vaginal bleeding, contractions, leaking of fluid and fetal movement were reviewed in detail with the patient. Please refer to After Visit Summary for other counseling recommendations.   Return in 2 weeks (on 05/29/2021) for [facility], needs MD, in person.  Future Appointments  Date Time Provider Department Center  06/10/2021  3:00 PM [facility] NURSE [facility]  06/10/2021  3:15 PM [facility] US2 [facility]    Reva Bores, MD

## 2021-05-15 NOTE — Patient Instructions (Signed)

## 2021-05-16 LAB — GLUCOSE TOLERANCE, 2 HOURS W/ 1HR
Glucose, 1 hour: 146 mg/dL (ref 65–179)
Glucose, 2 hour: 117 mg/dL (ref 65–152)
Glucose, Fasting: 95 mg/dL — ABNORMAL HIGH (ref 65–91)

## 2021-05-16 LAB — CBC
Hematocrit: 33.7 % — ABNORMAL LOW (ref 34.0–46.6)
Hemoglobin: 10.6 g/dL — ABNORMAL LOW (ref 11.1–15.9)
MCH: 22.7 pg — ABNORMAL LOW (ref 26.6–33.0)
MCHC: 31.5 g/dL (ref 31.5–35.7)
MCV: 72 fL — ABNORMAL LOW (ref 79–97)
Platelets: 473 10*3/uL — ABNORMAL HIGH (ref 150–450)
RBC: 4.66 x10E6/uL (ref 3.77–5.28)
RDW: 21.3 % — ABNORMAL HIGH (ref 11.7–15.4)
WBC: 12.1 10*3/uL — ABNORMAL HIGH (ref 3.4–10.8)

## 2021-05-16 LAB — HIV ANTIBODY (ROUTINE TESTING W REFLEX): HIV Screen 4th Generation wRfx: NONREACTIVE

## 2021-05-16 LAB — RPR: RPR Ser Ql: NONREACTIVE

## 2021-05-18 ENCOUNTER — Telehealth: Payer: Self-pay

## 2021-05-18 ENCOUNTER — Encounter: Payer: Self-pay | Admitting: Family Medicine

## 2021-05-18 ENCOUNTER — Encounter: Payer: Self-pay | Admitting: *Deleted

## 2021-05-18 DIAGNOSIS — O24419 Gestational diabetes mellitus in pregnancy, unspecified control: Secondary | ICD-10-CM | POA: Insufficient documentation

## 2021-05-18 MED ORDER — ACCU-CHEK GUIDE W/DEVICE KIT: 1.0000 | PACK | Freq: Once | 0 refills | Status: AC

## 2021-05-18 MED ORDER — ACCU-CHEK GUIDE VI STRP: ORAL_STRIP | 12 refills | Status: DC

## 2021-05-18 MED ORDER — ACCU-CHEK SOFTCLIX LANCETS MISC: 12 refills | Status: DC

## 2021-05-18 NOTE — Telephone Encounter (Addendum)
-----   Message from Reva Bores, MD sent at 05/18/2021  7:50 AM EDT ----- Has GDM--please inform pt and get to D and N mgmt asap   Called pt; VM left stating I am calling with results and pt may return my call or check MyChart for more details. Callback number given. Referral placed and supplies ordered. MyChart message sent. Front office notified to schedule.

## 2021-05-26 ENCOUNTER — Other Ambulatory Visit: Payer: Medicaid Other

## 2021-05-26 ENCOUNTER — Other Ambulatory Visit: Payer: Self-pay

## 2021-05-26 DIAGNOSIS — R7303 Prediabetes: Secondary | ICD-10-CM

## 2021-05-26 DIAGNOSIS — D509 Iron deficiency anemia, unspecified: Secondary | ICD-10-CM

## 2021-05-27 ENCOUNTER — Telehealth: Payer: Self-pay | Admitting: Lactation Services

## 2021-05-27 LAB — GLUCOSE TOLERANCE, 2 HOURS W/ 1HR
Glucose, 1 hour: 103 mg/dL (ref 65–179)
Glucose, 2 hour: 102 mg/dL (ref 65–152)
Glucose, Fasting: 87 mg/dL (ref 65–91)

## 2021-05-27 LAB — FERRITIN: Ferritin: 13 ng/mL — ABNORMAL LOW (ref 15–150)

## 2021-05-27 NOTE — Progress Notes (Signed)
Normal 2 hr GTT at 28 weeks, but had slightly abnormal test at 26 weeks (fasting value was 95) on 05/15/2021. Patient reported she was not fasting for the other test.  Reassured by normal values for this test, does not currently meet criteria for GDM, this was removed from her problem list. Results were released to MyChart and patient was given recommendations as indicated.   Jaynie Collins, MD

## 2021-05-27 NOTE — Telephone Encounter (Signed)
Called patient to inform her she passed her 2 hour glucose test, she did not answer.   LM for her to check her My Chart for message from Dr. Macon Large. Advised to call the office at [phone] with any questions or concerns.

## 2021-05-27 NOTE — Telephone Encounter (Signed)
-----   Message from Tereso Newcomer, MD sent at 05/27/2021  9:09 AM EDT ----- Normal 2 hr GTT at 28 weeks, but had slightly abnormal test at 26 weeks (fasting value was 95) on 05/15/2021. Patient reported she was not fasting for the other test.  Reassured by normal values for this test, does not currently meet criteria for GDM, this was removed from her problem list. Results were released to MyChart and patient was given recommendations as indicated.   Jaynie Collins, MD

## 2021-06-01 ENCOUNTER — Encounter: Payer: Medicaid Other | Admitting: Obstetrics and Gynecology

## 2021-06-10 ENCOUNTER — Ambulatory Visit: Payer: Medicaid Other

## 2021-06-10 ENCOUNTER — Encounter: Payer: Medicaid Other | Admitting: Family Medicine

## 2021-06-24 ENCOUNTER — Ambulatory Visit (INDEPENDENT_AMBULATORY_CARE_PROVIDER_SITE_OTHER): Payer: Medicaid Other | Admitting: Obstetrics and Gynecology

## 2021-06-24 ENCOUNTER — Other Ambulatory Visit: Payer: Self-pay

## 2021-06-24 ENCOUNTER — Encounter: Payer: Self-pay | Admitting: Obstetrics and Gynecology

## 2021-06-24 VITALS — BP 129/78 | HR 93 | Wt 326.0 lb

## 2021-06-24 DIAGNOSIS — O0992 Supervision of high risk pregnancy, unspecified, second trimester: Secondary | ICD-10-CM

## 2021-06-24 DIAGNOSIS — O10913 Unspecified pre-existing hypertension complicating pregnancy, third trimester: Secondary | ICD-10-CM

## 2021-06-24 DIAGNOSIS — D509 Iron deficiency anemia, unspecified: Secondary | ICD-10-CM

## 2021-06-24 DIAGNOSIS — O99011 Anemia complicating pregnancy, first trimester: Secondary | ICD-10-CM

## 2021-06-24 NOTE — Patient Instructions (Signed)

## 2021-06-24 NOTE — Progress Notes (Signed)
Subjective:  Lauren Morales is a 25 y.o. G1P0 at [redacted]w[redacted]d being seen today for ongoing prenatal care.  She is currently monitored for the following issues for this high-risk pregnancy and has Supervision of high-risk pregnancy; Maternal morbid obesity, antepartum (HCC); Chronic hypertension complicating or reason for care during pregnancy; Maternal iron deficiency anemia affecting pregnancy, antepartum, first trimester; and BMI 50.0-59.9, adult (HCC) on their problem list.  Patient reports no complaints.  Contractions: Not present. Vag. Bleeding: None.  Movement: Present. Denies leaking of fluid.   The following portions of the patient's history were reviewed and updated as appropriate: allergies, current medications, past family history, past medical history, past social history, past surgical history and problem list. Problem list updated.  Objective:   Vitals:   06/24/21 0841  BP: 129/78  Pulse: 93  Weight: (!) 326 lb (147.9 kg)    Fetal Status: Fetal Heart Rate (bpm): 140   Movement: Present     General:  Alert, oriented and cooperative. Patient is in no acute distress.  Skin: Skin is warm and dry. No rash noted.   Cardiovascular: Normal heart rate noted  Respiratory: Normal respiratory effort, no problems with respiration noted  Abdomen: Soft, gravid, appropriate for gestational age. Pain/Pressure: Absent     Pelvic:  Cervical exam deferred        Extremities: Normal range of motion.  Edema: None  Mental Status: Normal mood and affect. Normal behavior. Normal judgment and thought content.   Urinalysis:      Assessment and Plan:  Pregnancy: G1P0 at [redacted]w[redacted]d  1. Supervision of high risk pregnancy in second trimester Decline Tdap  2. Maternal chronic hypertension in third trimester BP stable Continue with current regiment Growth scan next week Start weekly antenatal testing  3. Maternal iron deficiency anemia affecting pregnancy, antepartum, first trimester Stable  Preterm  labor symptoms and general obstetric precautions including but not limited to vaginal bleeding, contractions, leaking of fluid and fetal movement were reviewed in detail with the patient. Please refer to After Visit Summary for other counseling recommendations.  Return in about 2 weeks (around 07/08/2021) for OB visit, face to face, MD only.   Hermina Staggers, MD

## 2021-07-01 ENCOUNTER — Ambulatory Visit: Payer: Medicaid Other | Admitting: *Deleted

## 2021-07-01 ENCOUNTER — Encounter: Payer: Self-pay | Admitting: *Deleted

## 2021-07-01 ENCOUNTER — Other Ambulatory Visit: Payer: Self-pay

## 2021-07-01 ENCOUNTER — Other Ambulatory Visit: Payer: Self-pay | Admitting: Obstetrics

## 2021-07-01 ENCOUNTER — Other Ambulatory Visit: Payer: Self-pay | Admitting: *Deleted

## 2021-07-01 ENCOUNTER — Ambulatory Visit: Payer: Medicaid Other | Attending: Obstetrics

## 2021-07-01 VITALS — BP 131/87 | HR 101

## 2021-07-01 DIAGNOSIS — D509 Iron deficiency anemia, unspecified: Secondary | ICD-10-CM | POA: Insufficient documentation

## 2021-07-01 DIAGNOSIS — O10912 Unspecified pre-existing hypertension complicating pregnancy, second trimester: Secondary | ICD-10-CM | POA: Diagnosis present

## 2021-07-01 DIAGNOSIS — Z6841 Body Mass Index (BMI) 40.0 and over, adult: Secondary | ICD-10-CM

## 2021-07-01 DIAGNOSIS — O99213 Obesity complicating pregnancy, third trimester: Secondary | ICD-10-CM

## 2021-07-01 DIAGNOSIS — Z3A33 33 weeks gestation of pregnancy: Secondary | ICD-10-CM | POA: Diagnosis not present

## 2021-07-01 DIAGNOSIS — O10013 Pre-existing essential hypertension complicating pregnancy, third trimester: Secondary | ICD-10-CM

## 2021-07-01 DIAGNOSIS — O99011 Anemia complicating pregnancy, first trimester: Secondary | ICD-10-CM | POA: Diagnosis present

## 2021-07-07 ENCOUNTER — Ambulatory Visit: Payer: Medicaid Other

## 2021-07-07 ENCOUNTER — Other Ambulatory Visit: Payer: Medicaid Other

## 2021-07-07 ENCOUNTER — Ambulatory Visit: Payer: Medicaid Other | Attending: Obstetrics and Gynecology

## 2021-07-14 ENCOUNTER — Encounter: Payer: Medicaid Other | Admitting: Family Medicine

## 2021-07-15 ENCOUNTER — Ambulatory Visit (INDEPENDENT_AMBULATORY_CARE_PROVIDER_SITE_OTHER): Payer: Medicaid Other

## 2021-07-15 ENCOUNTER — Other Ambulatory Visit: Payer: Self-pay

## 2021-07-15 ENCOUNTER — Other Ambulatory Visit: Payer: Medicaid Other

## 2021-07-15 ENCOUNTER — Ambulatory Visit: Payer: Medicaid Other | Admitting: *Deleted

## 2021-07-15 VITALS — BP 126/96 | HR 83 | Wt 338.2 lb

## 2021-07-15 DIAGNOSIS — O10913 Unspecified pre-existing hypertension complicating pregnancy, third trimester: Secondary | ICD-10-CM

## 2021-07-15 NOTE — Progress Notes (Signed)

## 2021-07-20 ENCOUNTER — Ambulatory Visit: Payer: Medicaid Other | Admitting: *Deleted

## 2021-07-20 ENCOUNTER — Other Ambulatory Visit: Payer: Self-pay

## 2021-07-20 ENCOUNTER — Ambulatory Visit (INDEPENDENT_AMBULATORY_CARE_PROVIDER_SITE_OTHER): Payer: Medicaid Other

## 2021-07-20 VITALS — BP 119/90 | HR 115

## 2021-07-20 DIAGNOSIS — O10913 Unspecified pre-existing hypertension complicating pregnancy, third trimester: Secondary | ICD-10-CM | POA: Diagnosis not present

## 2021-07-20 NOTE — Progress Notes (Signed)

## 2021-07-21 ENCOUNTER — Other Ambulatory Visit (HOSPITAL_COMMUNITY)
Admission: RE | Admit: 2021-07-21 | Discharge: 2021-07-21 | Disposition: A | Discharge status: Home / Self Care | Payer: Medicaid Other | Source: Ambulatory Visit | Attending: Family Medicine | Admitting: Family Medicine

## 2021-07-21 ENCOUNTER — Ambulatory Visit (INDEPENDENT_AMBULATORY_CARE_PROVIDER_SITE_OTHER): Payer: Medicaid Other | Admitting: Family Medicine

## 2021-07-21 ENCOUNTER — Encounter: Payer: Self-pay | Admitting: Family Medicine

## 2021-07-21 VITALS — BP 150/103 | HR 106

## 2021-07-21 DIAGNOSIS — O0992 Supervision of high risk pregnancy, unspecified, second trimester: Secondary | ICD-10-CM | POA: Insufficient documentation

## 2021-07-21 DIAGNOSIS — O99210 Obesity complicating pregnancy, unspecified trimester: Secondary | ICD-10-CM

## 2021-07-21 DIAGNOSIS — O10913 Unspecified pre-existing hypertension complicating pregnancy, third trimester: Secondary | ICD-10-CM

## 2021-07-21 DIAGNOSIS — O10911 Unspecified pre-existing hypertension complicating pregnancy, first trimester: Secondary | ICD-10-CM

## 2021-07-21 MED ORDER — NIFEDIPINE ER 90 MG PO TB24: 90.0000 mg | ORAL_TABLET | Freq: Every day | ORAL | 2 refills | Status: DC

## 2021-07-21 NOTE — Patient Instructions (Signed)

## 2021-07-21 NOTE — Progress Notes (Signed)
   Subjective:  Lauren Morales is a 25 y.o. G1P0 at [redacted]w[redacted]d being seen today for ongoing prenatal care.  She is currently monitored for the following issues for this high-risk pregnancy and has Supervision of high-risk pregnancy; Maternal morbid obesity, antepartum (HCC); Chronic hypertension complicating or reason for care during pregnancy; Maternal iron deficiency anemia affecting pregnancy, antepartum, first trimester; and BMI 50.0-59.9, adult (HCC) on their problem list.  Patient reports no complaints.  Contractions: Irritability. Vag. Bleeding: None.  Movement: Present. Denies leaking of fluid.   The following portions of the patient's history were reviewed and updated as appropriate: allergies, current medications, past family history, past medical history, past social history, past surgical history and problem list. Problem list updated.  Objective:   Vitals:   07/21/21 1432 07/21/21 1553  BP: (!) 143/100 (!) 150/103  Pulse: (!) 106     Fetal Status: Fetal Heart Rate (bpm): 165   Movement: Present     General:  Alert, oriented and cooperative. Patient is in no acute distress.  Skin: Skin is warm and dry. No rash noted.   Cardiovascular: Normal heart rate noted  Respiratory: Normal respiratory effort, no problems with respiration noted  Abdomen: Soft, gravid, appropriate for gestational age. Pain/Pressure: Present     Pelvic: Vag. Bleeding: None     Cervical exam performed Dilation: Closed Effacement (%): Thick    Extremities: Normal range of motion.  Edema: Trace  Mental Status: Normal mood and affect. Normal behavior. Normal judgment and thought content.   Urinalysis:      Assessment and Plan:  Pregnancy: G1P0 at [redacted]w[redacted]d  1. Supervision of high risk pregnancy in second trimester BP elevated, see below FHR normal  2. Maternal chronic hypertension in third trimester BP elevated, increase nifedipine 60>90 mg daily Weekly testing has been reassuring to date Given escalating BP  discussed IOL at 38wks, she is amenable with this plan Form faxed, orders placed Has follow up growth scan next week  3. Maternal morbid obesity, antepartum (HCC)   Preterm labor symptoms and general obstetric precautions including but not limited to vaginal bleeding, contractions, leaking of fluid and fetal movement were reviewed in detail with the patient. Please refer to After Visit Summary for other counseling recommendations.  Return in 1 week (on 07/28/2021) for [facility], ob visit.   Venora Maples, MD

## 2021-07-22 ENCOUNTER — Other Ambulatory Visit: Payer: Medicaid Other

## 2021-07-22 LAB — GC/CHLAMYDIA PROBE AMP (~~LOC~~) NOT AT ARMC
Chlamydia: NEGATIVE
Comment: NEGATIVE
Comment: NORMAL
Neisseria Gonorrhea: NEGATIVE

## 2021-07-24 ENCOUNTER — Encounter (HOSPITAL_COMMUNITY): Payer: Self-pay | Admitting: Obstetrics & Gynecology

## 2021-07-24 ENCOUNTER — Encounter: Payer: Self-pay | Admitting: Family Medicine

## 2021-07-24 ENCOUNTER — Other Ambulatory Visit: Payer: Self-pay

## 2021-07-24 ENCOUNTER — Inpatient Hospital Stay (HOSPITAL_COMMUNITY)
Admission: AD | Admit: 2021-07-24 | Discharge: 2021-07-24 | Disposition: A | Discharge status: Home / Self Care | Payer: Medicaid Other | Attending: Obstetrics & Gynecology | Admitting: Obstetrics & Gynecology

## 2021-07-24 ENCOUNTER — Inpatient Hospital Stay (HOSPITAL_BASED_OUTPATIENT_CLINIC_OR_DEPARTMENT_OTHER): Payer: Medicaid Other

## 2021-07-24 DIAGNOSIS — D509 Iron deficiency anemia, unspecified: Secondary | ICD-10-CM | POA: Diagnosis not present

## 2021-07-24 DIAGNOSIS — O4693 Antepartum hemorrhage, unspecified, third trimester: Secondary | ICD-10-CM

## 2021-07-24 DIAGNOSIS — O99213 Obesity complicating pregnancy, third trimester: Secondary | ICD-10-CM | POA: Diagnosis not present

## 2021-07-24 DIAGNOSIS — Z3A36 36 weeks gestation of pregnancy: Secondary | ICD-10-CM | POA: Diagnosis not present

## 2021-07-24 DIAGNOSIS — O0993 Supervision of high risk pregnancy, unspecified, third trimester: Secondary | ICD-10-CM

## 2021-07-24 DIAGNOSIS — O368130 Decreased fetal movements, third trimester, not applicable or unspecified: Secondary | ICD-10-CM | POA: Insufficient documentation

## 2021-07-24 DIAGNOSIS — Z3689 Encounter for other specified antenatal screening: Secondary | ICD-10-CM

## 2021-07-24 DIAGNOSIS — O4690 Antepartum hemorrhage, unspecified, unspecified trimester: Secondary | ICD-10-CM

## 2021-07-24 DIAGNOSIS — O368190 Decreased fetal movements, unspecified trimester, not applicable or unspecified: Secondary | ICD-10-CM

## 2021-07-24 DIAGNOSIS — Z6790 Unspecified blood type, Rh positive: Secondary | ICD-10-CM

## 2021-07-24 DIAGNOSIS — O99820 Streptococcus B carrier state complicating pregnancy: Secondary | ICD-10-CM | POA: Insufficient documentation

## 2021-07-24 DIAGNOSIS — O10013 Pre-existing essential hypertension complicating pregnancy, third trimester: Secondary | ICD-10-CM

## 2021-07-24 DIAGNOSIS — O99013 Anemia complicating pregnancy, third trimester: Secondary | ICD-10-CM | POA: Insufficient documentation

## 2021-07-24 DIAGNOSIS — O99011 Anemia complicating pregnancy, first trimester: Secondary | ICD-10-CM

## 2021-07-24 DIAGNOSIS — Z7982 Long term (current) use of aspirin: Secondary | ICD-10-CM | POA: Insufficient documentation

## 2021-07-24 LAB — CULTURE, BETA STREP (GROUP B ONLY): Strep Gp B Culture: POSITIVE — AB

## 2021-07-24 LAB — URINALYSIS, ROUTINE W REFLEX MICROSCOPIC
Glucose, UA: NEGATIVE mg/dL
Hgb urine dipstick: NEGATIVE
Ketones, ur: NEGATIVE mg/dL
Nitrite: NEGATIVE
Protein, ur: 30 mg/dL — AB
Specific Gravity, Urine: 1.025 (ref 1.005–1.030)
pH: 6 (ref 5.0–8.0)

## 2021-07-24 NOTE — MAU Provider Note (Signed)
History     CSN: [id number]  Arrival date and time: 07/24/21 1546   Event Date/Time   First Provider Initiated Contact with Patient 07/24/21 1625      Chief Complaint  Patient presents with   Vaginal Bleeding   Abdominal Pain   Decreased Fetal Movement   25 y.o. G1 @36 .6 wks presenting with VB and abdominal cramping/ctx. Reports onset of bleeding since yesterday. She only sees the blood in the toilet after she urinates. She does not think the blood is in the urine. She reports regular ctx earlier but now they are infrequent. Denies LOF. Denies urinary sx. No recent sex. Also reports decreased FM today. Her pregnancy is complicated by [facility], obesity, GBS carrier, and Anemia.   OB History     Gravida  1   Para      Term      Preterm      AB      Living         SAB      IAB      Ectopic      Multiple      Live Births              Past Medical History:  Diagnosis Date   Cholelithiasis 12/04/2020    Past Surgical History:  Procedure Laterality Date   WRIST SURGERY      History reviewed. No pertinent family history.  Social History   Tobacco Use   Smoking status: Never   Smokeless tobacco: Never  Vaping Use   Vaping Use: Never used  Substance Use Topics   Alcohol use: Yes   Drug use: Not Currently    Allergies: No Known Allergies  Medications Prior to Admission  Medication Sig Dispense Refill Last Dose   aspirin EC 81 MG tablet Take 1 tablet (81 mg total) by mouth daily. Take after 12 weeks for prevention of preeclampsia later in pregnancy 300 tablet 2 07/24/2021   NIFEdipine (ADALAT CC) 90 MG 24 hr tablet Take 1 tablet (90 mg total) by mouth daily. 30 tablet 2 07/24/2021   Prenatal Vit-Fe Fumarate-FA (PRENATAL MULTIVITAMIN) TABS tablet Take 1 tablet by mouth daily at 12 noon.   Past Week   ferrous sulfate 325 (65 FE) MG tablet Take 1 tablet (325 mg total) by mouth every other day. 30 tablet 3     Review of Systems  Gastrointestinal:   Positive for abdominal pain. Negative for constipation and diarrhea.  Genitourinary:  Positive for vaginal bleeding. Negative for dysuria, frequency, urgency and vaginal discharge.  Physical Exam   Blood pressure 130/67, pulse 96, temperature 98.6 F (37 C), temperature source Oral, resp. rate 15, height 5\' 4"  (1.626 m), weight (!) 152.1 kg, last menstrual period 11/04/2020, SpO2 98 %.  Physical Exam Vitals and nursing note reviewed. Exam conducted with a chaperone present.  Constitutional:      General: She is not in acute distress.    Appearance: Normal appearance.  HENT:     Head: Normocephalic and atraumatic.  Pulmonary:     Effort: Pulmonary effort is normal. No respiratory distress.  Abdominal:     General: There is no distension.     Tenderness: There is no abdominal tenderness.  Genitourinary:    Comments: External: no lesions or erythema Vagina: rugated, pink, moist, thin white discharge, no blood Cervix FT/thick   Musculoskeletal:        General: Normal range of motion.     Cervical back: Normal  range of motion.  Skin:    General: Skin is warm and dry.  Neurological:     General: No focal deficit present.     Mental Status: She is alert and oriented to person, place, and time.  Psychiatric:        Mood and Affect: Mood normal.        Behavior: Behavior normal.   EFM: 155 bpm, mod variability, + accels, no decels Toco: irregular  Results for orders placed or performed during the hospital encounter of 07/24/21 (from the past 24 hour(s))  Urinalysis, Routine w reflex microscopic Urine, Clean Catch     Status: Abnormal   Collection Time: 07/24/21  5:42 PM  Result Value Ref Range   Color, Urine AMBER (A) YELLOW   APPearance CLOUDY (A) CLEAR   Specific Gravity, Urine 1.025 1.005 - 1.030   pH 6.0 5.0 - 8.0   Glucose, UA NEGATIVE NEGATIVE mg/dL   Hgb urine dipstick NEGATIVE NEGATIVE   Bilirubin Urine SMALL (A) NEGATIVE   Ketones, ur NEGATIVE NEGATIVE mg/dL    Protein, ur 30 (A) NEGATIVE mg/dL   Nitrite NEGATIVE NEGATIVE   Leukocytes,Ua MODERATE (A) NEGATIVE   RBC / HPF 0-5 0 - 5 RBC/hpf   WBC, UA 21-50 0 - 5 WBC/hpf   Bacteria, UA RARE (A) NONE SEEN   Squamous Epithelial / LPF 11-20 0 - 5   Mucus PRESENT    Hyaline Casts, UA PRESENT    MAU Course  Procedures  MDM Labs and [place] ordered and reviewed. No signs of VB or PTL. BPP 8/8, AFI 7.6 cm. Pt reports more FM since EFM applied. Consult with Dr. Despina Hidden, plan for rpt AFI in 1 week, pt is already scheduled for [place] in 5 days. She is stable for discharge home.   Assessment and Plan   1. Maternal iron deficiency anemia affecting pregnancy, antepartum, first trimester   2. Supervision of high risk pregnancy in third trimester   3. Decreased fetal movement   4. Vaginal bleeding in pregnancy   5. [redacted] weeks gestation of pregnancy   6. NST (non-stress test) reactive   7. Blood type, Rh positive    Discharge home Follow up at [facility] in [facility] Bleeding precautions  Allergies as of 07/24/2021   No Known Allergies      Medication List     TAKE these medications    aspirin EC 81 MG tablet Take 1 tablet (81 mg total) by mouth daily. Take after 12 weeks for prevention of preeclampsia later in pregnancy   ferrous sulfate 325 (65 FE) MG tablet Take 1 tablet (325 mg total) by mouth every other day.   NIFEdipine 90 MG 24 hr tablet Commonly known as: ADALAT CC Take 1 tablet (90 mg total) by mouth daily.   prenatal multivitamin Tabs tablet Take 1 tablet by mouth daily at 12 noon.        Donette Larry, CNM 07/24/2021, 7:00 PM

## 2021-07-24 NOTE — MAU Note (Signed)
Experiencing bleeding and cramping since yesterday.  No recent exams. Baby is not moving as much as usual.

## 2021-07-27 ENCOUNTER — Telehealth (HOSPITAL_COMMUNITY): Payer: Self-pay | Admitting: *Deleted

## 2021-07-27 NOTE — Telephone Encounter (Signed)
Preadmission screen  

## 2021-07-28 ENCOUNTER — Telehealth (HOSPITAL_COMMUNITY): Payer: Self-pay | Admitting: *Deleted

## 2021-07-28 NOTE — Telephone Encounter (Signed)
Preadmission screen  

## 2021-07-29 ENCOUNTER — Encounter: Payer: Self-pay | Admitting: Obstetrics and Gynecology

## 2021-07-29 ENCOUNTER — Telehealth (HOSPITAL_COMMUNITY): Payer: Self-pay | Admitting: *Deleted

## 2021-07-29 ENCOUNTER — Other Ambulatory Visit: Payer: Self-pay

## 2021-07-29 ENCOUNTER — Ambulatory Visit: Payer: Medicaid Other | Admitting: *Deleted

## 2021-07-29 ENCOUNTER — Ambulatory Visit (HOSPITAL_BASED_OUTPATIENT_CLINIC_OR_DEPARTMENT_OTHER): Payer: Medicaid Other

## 2021-07-29 ENCOUNTER — Encounter (HOSPITAL_COMMUNITY): Payer: Self-pay | Admitting: Obstetrics & Gynecology

## 2021-07-29 ENCOUNTER — Inpatient Hospital Stay (HOSPITAL_COMMUNITY)
Admission: AD | Admit: 2021-07-29 | Discharge: 2021-08-02 | DRG: 788 | Disposition: A | Discharge status: Home / Self Care | Payer: Medicaid Other | Attending: Family Medicine | Admitting: Family Medicine

## 2021-07-29 ENCOUNTER — Ambulatory Visit (INDEPENDENT_AMBULATORY_CARE_PROVIDER_SITE_OTHER): Payer: Medicaid Other | Admitting: Obstetrics and Gynecology

## 2021-07-29 ENCOUNTER — Encounter: Payer: Self-pay | Admitting: *Deleted

## 2021-07-29 ENCOUNTER — Ambulatory Visit (HOSPITAL_BASED_OUTPATIENT_CLINIC_OR_DEPARTMENT_OTHER): Payer: Medicaid Other | Admitting: Obstetrics

## 2021-07-29 VITALS — BP 147/95 | HR 104 | Wt 329.8 lb

## 2021-07-29 VITALS — BP 134/103 | HR 88

## 2021-07-29 DIAGNOSIS — O10913 Unspecified pre-existing hypertension complicating pregnancy, third trimester: Secondary | ICD-10-CM

## 2021-07-29 DIAGNOSIS — O99213 Obesity complicating pregnancy, third trimester: Secondary | ICD-10-CM | POA: Diagnosis not present

## 2021-07-29 DIAGNOSIS — O99013 Anemia complicating pregnancy, third trimester: Secondary | ICD-10-CM

## 2021-07-29 DIAGNOSIS — Z3A37 37 weeks gestation of pregnancy: Secondary | ICD-10-CM

## 2021-07-29 DIAGNOSIS — O0990 Supervision of high risk pregnancy, unspecified, unspecified trimester: Secondary | ICD-10-CM

## 2021-07-29 DIAGNOSIS — O1002 Pre-existing essential hypertension complicating childbirth: Secondary | ICD-10-CM | POA: Diagnosis present

## 2021-07-29 DIAGNOSIS — O99820 Streptococcus B carrier state complicating pregnancy: Secondary | ICD-10-CM

## 2021-07-29 DIAGNOSIS — O0993 Supervision of high risk pregnancy, unspecified, third trimester: Secondary | ICD-10-CM

## 2021-07-29 DIAGNOSIS — I10 Essential (primary) hypertension: Secondary | ICD-10-CM | POA: Diagnosis present

## 2021-07-29 DIAGNOSIS — D509 Iron deficiency anemia, unspecified: Secondary | ICD-10-CM | POA: Insufficient documentation

## 2021-07-29 DIAGNOSIS — O99824 Streptococcus B carrier state complicating childbirth: Secondary | ICD-10-CM | POA: Diagnosis present

## 2021-07-29 DIAGNOSIS — Z20822 Contact with and (suspected) exposure to covid-19: Secondary | ICD-10-CM | POA: Diagnosis present

## 2021-07-29 DIAGNOSIS — Z6841 Body Mass Index (BMI) 40.0 and over, adult: Secondary | ICD-10-CM | POA: Insufficient documentation

## 2021-07-29 DIAGNOSIS — O99214 Obesity complicating childbirth: Secondary | ICD-10-CM | POA: Diagnosis present

## 2021-07-29 DIAGNOSIS — O114 Pre-existing hypertension with pre-eclampsia, complicating childbirth: Secondary | ICD-10-CM | POA: Diagnosis present

## 2021-07-29 DIAGNOSIS — D649 Anemia, unspecified: Secondary | ICD-10-CM

## 2021-07-29 DIAGNOSIS — O10013 Pre-existing essential hypertension complicating pregnancy, third trimester: Secondary | ICD-10-CM | POA: Diagnosis not present

## 2021-07-29 DIAGNOSIS — O283 Abnormal ultrasonic finding on antenatal screening of mother: Secondary | ICD-10-CM | POA: Insufficient documentation

## 2021-07-29 DIAGNOSIS — O1414 Severe pre-eclampsia complicating childbirth: Secondary | ICD-10-CM | POA: Diagnosis not present

## 2021-07-29 DIAGNOSIS — O99011 Anemia complicating pregnancy, first trimester: Secondary | ICD-10-CM | POA: Insufficient documentation

## 2021-07-29 DIAGNOSIS — O10919 Unspecified pre-existing hypertension complicating pregnancy, unspecified trimester: Secondary | ICD-10-CM | POA: Diagnosis present

## 2021-07-29 DIAGNOSIS — O9902 Anemia complicating childbirth: Secondary | ICD-10-CM | POA: Diagnosis present

## 2021-07-29 DIAGNOSIS — Z98891 History of uterine scar from previous surgery: Secondary | ICD-10-CM

## 2021-07-29 HISTORY — DX: Anemia, unspecified: D64.9

## 2021-07-29 HISTORY — DX: Essential (primary) hypertension: I10

## 2021-07-29 LAB — URINALYSIS, ROUTINE W REFLEX MICROSCOPIC
Bilirubin Urine: NEGATIVE
Glucose, UA: NEGATIVE mg/dL
Hgb urine dipstick: NEGATIVE
Ketones, ur: NEGATIVE mg/dL
Nitrite: NEGATIVE
Protein, ur: NEGATIVE mg/dL
Specific Gravity, Urine: 1.03 (ref 1.005–1.030)
pH: 5 (ref 5.0–8.0)

## 2021-07-29 MED ORDER — MISOPROSTOL 50MCG HALF TABLET
50.0000 ug | ORAL_TABLET | ORAL | Status: DC | PRN
  Administered 2021-07-30 (×2): 50 ug via ORAL
  Filled 2021-07-29 (×2): qty 1

## 2021-07-29 MED ORDER — ACETAMINOPHEN 325 MG PO TABS: 650.0000 mg | ORAL_TABLET | ORAL | Status: DC | PRN

## 2021-07-29 MED ORDER — OXYTOCIN-SODIUM CHLORIDE 30-0.9 UT/500ML-% IV SOLN: 2.5000 [IU]/h | INTRAVENOUS | Status: DC

## 2021-07-29 MED ORDER — LIDOCAINE HCL (PF) 1 % IJ SOLN: 30.0000 mL | INTRAMUSCULAR | Status: DC | PRN

## 2021-07-29 MED ORDER — FENTANYL CITRATE (PF) 100 MCG/2ML IJ SOLN: 50.0000 ug | INTRAMUSCULAR | Status: DC | PRN

## 2021-07-29 MED ORDER — LACTATED RINGERS IV SOLN
500.0000 mL | INTRAVENOUS | Status: DC | PRN
  Administered 2021-07-30: 250 mL via INTRAVENOUS
  Administered 2021-07-30: 500 mL via INTRAVENOUS

## 2021-07-29 MED ORDER — OXYTOCIN BOLUS FROM INFUSION: 333.0000 mL | Freq: Once | INTRAVENOUS | Status: DC

## 2021-07-29 MED ORDER — SOD CITRATE-CITRIC ACID 500-334 MG/5ML PO SOLN
30.0000 mL | ORAL | Status: DC | PRN
  Administered 2021-07-30: 30 mL via ORAL
  Filled 2021-07-29: qty 30

## 2021-07-29 MED ORDER — ONDANSETRON HCL 4 MG/2ML IJ SOLN
4.0000 mg | Freq: Four times a day (QID) | INTRAMUSCULAR | Status: DC | PRN
  Filled 2021-07-29: qty 2

## 2021-07-29 MED ORDER — LACTATED RINGERS IV SOLN
INTRAVENOUS | Status: DC
  Administered 2021-07-30: 75 mL via INTRAVENOUS

## 2021-07-29 MED ORDER — TERBUTALINE SULFATE 1 MG/ML IJ SOLN
0.2500 mg | Freq: Once | INTRAMUSCULAR | Status: AC | PRN
  Administered 2021-07-30: 0.25 mg via SUBCUTANEOUS
  Filled 2021-07-29: qty 1

## 2021-07-29 NOTE — Progress Notes (Signed)
BP reading at 138/101, patient reports she had not taken her medication this morning. Typically takes at noon with food.

## 2021-07-29 NOTE — Progress Notes (Signed)
Subjective:  Lauren Morales is a 25 y.o. G1P0 at [redacted]w[redacted]d being seen today for ongoing prenatal care.  She is currently monitored for the following issues for this high-risk pregnancy and has Supervision of high-risk pregnancy; Maternal morbid obesity, antepartum ([place]); Chronic hypertension complicating or reason for care during pregnancy; Maternal iron deficiency anemia affecting pregnancy, antepartum, first trimester; BMI 50.0-59.9, adult ([place]); and GBS (group B Streptococcus carrier), +RV culture, currently pregnant on their problem list.  Patient reports general discomforts of pregnancy. Denies HA or visual changes.  Contractions: Irritability. Vag. Bleeding: None.  Movement: Present. Denies leaking of fluid.   The following portions of the patient's history were reviewed and updated as appropriate: allergies, current medications, past family history, past medical history, past social history, past surgical history and problem list. Problem list updated.  Objective:   Vitals:   07/29/21 0830 07/29/21 0856  BP: (!) 138/101 (!) 147/95  Pulse: (!) 104   Weight: (!) 329 lb 12.8 oz (149.6 kg)     Fetal Status: Fetal Heart Rate (bpm): 145   Movement: Present     General:  Alert, oriented and cooperative. Patient is in no acute distress.  Skin: Skin is warm and dry. No rash noted.   Cardiovascular: Normal heart rate noted  Respiratory: Normal respiratory effort, no problems with respiration noted  Abdomen: Soft, gravid, appropriate for gestational age. Pain/Pressure: Present     Pelvic:  Cervical exam deferred        Extremities: Normal range of motion.  Edema: Trace  Mental Status: Normal mood and affect. Normal behavior. Normal judgment and thought content.   Urinalysis:      Assessment and Plan:  Pregnancy: G1P0 at [redacted]w[redacted]d  1. Supervision of high risk pregnancy in third trimester Stable Labor precautions IOL on Sat  2. Maternal chronic hypertension in third trimester Pt has not taken  her BP medication today, usually takes at 12 ( noon) No neurological Sx of SPEC at present. Will check labs Has MFM appt today Will have BP rechecked at that appt, if dys > 100, to L & D for IOL IOL scheduled for Sat S/Sx of SPEC reviewed with pt - CBC - Comp Met (CMET) - Protein / creatinine ratio, urine  3. GBS (group B Streptococcus carrier), +RV culture, currently pregnant Tx while in labor  Term labor symptoms and general obstetric precautions including but not limited to vaginal bleeding, contractions, leaking of fluid and fetal movement were reviewed in detail with the patient. Please refer to After Visit Summary for other counseling recommendations.  Return today (on 07/29/2021).   Chancy Milroy, MD

## 2021-07-29 NOTE — Procedures (Signed)
Tanesia Butner [birth year] [redacted]w[redacted]d  Fetus A Non-Stress Test Interpretation for 07/29/21  Indication: Unsatisfactory BPP, CHTN  Fetal Heart Rate A Mode: External Baseline Rate (A): 140 bpm Variability: Minimal, Moderate Accelerations: 15 x 15 Decelerations: None Multiple birth?: No  Uterine Activity Mode: Palpation, Toco Contraction Frequency (min): occ Contraction Duration (sec): 60-80 Contraction Quality: Mild Resting Tone Palpated: Relaxed Resting Time: Adequate  Interpretation (Fetal Testing) Nonstress Test Interpretation: Reactive Overall Impression: Reassuring for gestational age Comments: Dr. Parke Poisson reviewed tracing. Patient sent to [facility] for induction of labor due to elevated BP's.

## 2021-07-29 NOTE — MAU Note (Signed)
Pt sent from MFM for IOL but BS was unaware of pt's IOL. Here for EFM and assessment until pt can go to [facility]. Denies VB or LOF. Does have slight h/a. Reports FM is about the same and not like normal FM. No pain except for h/a

## 2021-07-29 NOTE — Patient Instructions (Signed)
Vaginal Delivery ?Vaginal delivery means that you give birth by pushing your baby out of your birth canal (vagina). Your health care team will help you before, during, and after vaginal delivery. ?Birth experiences are unique for every woman and every pregnancy, and birth experiences vary depending on where you choose to give birth. ?What are the risks and benefits? ?Generally, this is safe. However, problems may occur, including: ?Bleeding. ?Infection. ?Damage to other structures such as vaginal tearing. ?Allergic reactions to medicines. ?Despite the risks, benefits of vaginal delivery include less risk of bleeding and infection and a shorter recovery time compared to a Cesarean delivery. Cesarean delivery, or C-section, is the surgical delivery of a baby. ?What happens when I arrive at the birth center or hospital? ?Once you are in labor and have been admitted into the hospital or birth center, your health care team may: ?Review your pregnancy history and any concerns that you have. ?Talk with you about your birth plan and discuss pain control options. ?Check your blood pressure, breathing, and heartbeat. ?Assess your baby's heartbeat. ?Monitor your uterus for contractions. ?Check whether your bag of water (amniotic sac) has broken (ruptured). ?Insert an IV into one of your veins. This may be used to give you fluids and medicines. ?Monitoring ?Your health care team may assess your contractions (uterine monitoring) and your baby's heart rate (fetal monitoring). You may need to be monitored: ?Often, but not continuously (intermittently). ?All the time or for long periods at a time (continuously). Continuous monitoring may be needed if: ?You are taking certain medicines, such as medicine to relieve pain or make your contractions stronger. ?You have pregnancy or labor complications. ?Monitoring may be done by: ?Placing a special stethoscope or a handheld monitoring device on your abdomen to check your baby's heartbeat  and to check for contractions. ?Placing monitors on your abdomen (external monitors) to record your baby's heartbeat and the frequency and length of contractions. ?Placing monitors inside your uterus through your vagina (internal monitors) to record your baby's heartbeat and the frequency, length, and strength of your contractions. Depending on the type of monitor, it may remain in your uterus or on your baby's head until birth. ?Telemetry. This is a type of continuous monitoring that can be done with external or internal monitors. Instead of having to stay in bed, you are able to move around. ?Physical exam ?Your health care team may perform frequent physical exams. This may include: ?Checking how and where your baby is positioned in your uterus. ?Checking your cervix to determine: ?Whether it is thinning out (effacing). ?Whether it is opening up (dilating). ?What happens during labor and delivery? ?Normal labor and delivery is divided into the following three stages: ?Stage 1 ?This is the longest stage of labor. ?Throughout this stage, you will feel contractions. Contractions generally feel mild, infrequent, and irregular at first. They get stronger, more frequent, and more regular as you move through this stage. You may have contractions about every 2-3 minutes. ?This stage ends when your cervix is completely dilated to 4 inches (10 cm) and completely effaced. ?Stage 2 ?This stage starts once your cervix is completely effaced and dilated and lasts until the delivery of your baby. ?This is the stage where you will feel an urge to push your baby out of your vagina. ?You may feel stretching and burning pain, especially when the widest part of your baby's head passes through the vaginal opening (crowning). ?Once your baby is delivered, the umbilical cord will be clamped and   cut. Timing of cutting the cord will depend on your wishes, your baby's health, and your health care provider's practices. ?Your baby will be  placed on your bare chest (skin-to-skin contact) in an upright position and covered with a warm blanket. If you are choosing to breastfeed, watch your baby for feeding cues, like rooting or sucking, and help the baby to your breast for his or her first feeding. ?Stage 3 ?This stage starts immediately after the birth of your baby and ends after you deliver the placenta. ?This stage may take anywhere from 5 to 30 minutes. ?After your baby has been delivered, you will feel contractions as your body expels the placenta. These contractions also help your uterus get smaller and reduce bleeding. ?What can I expect after labor and delivery? ?After labor is over, you and your baby will be assessed closely until you are ready to go home. Your health care team will teach you how to care for yourself and your baby. ?You and your baby may be encouraged to stay in the same room (rooming in) during your hospital stay. This will help promote early bonding and successful breastfeeding. ?Your uterus will be checked and massaged regularly (fundal massage). ?You may continue to receive fluids and medicines through an IV. ?You will have some soreness and pain in your abdomen, vagina, and the area of skin between your vaginal opening and your anus (perineum). ?If an incision was made near your vagina (episiotomy) or if you had some vaginal tearing during delivery, cold compresses may be placed on your episiotomy or your tear. This helps to reduce pain and swelling. ?It is normal to have vaginal bleeding after delivery. Wear a sanitary pad for vaginal bleeding and discharge. ?Summary ?Vaginal delivery means that you will give birth by pushing your baby out of your birth canal (vagina). ?Your health care team will monitor you and your baby throughout the stages of labor. ?After you deliver your baby, your health care team will continue to assess you and your baby to ensure you are both recovering as expected after delivery. ?This  information is not intended to replace advice given to you by your health care provider. Make sure you discuss any questions you have with your health care provider. ?Document Revised: 10/27/2020 Document Reviewed: 10/27/2020 ?Elsevier Patient Education ? 2022 [employer] ? ?

## 2021-07-29 NOTE — Progress Notes (Signed)
Dr. Fang aware of elevated BP's. 

## 2021-07-29 NOTE — Telephone Encounter (Signed)
Preadmission screen  

## 2021-07-29 NOTE — Progress Notes (Signed)
MFM Note  Lauren Morales was seen for a follow up growth scan and biophysical due to chronic hypertension treated with nifedipine and maternal obesity with a BMI of 57.3.  Her blood pressures in our office today were 121/106 and 134/103.  The diastolic blood pressures were also above 100 during her prenatal visit this morning.  She denies any signs or symptoms of preeclampsia.  She was informed that the fetal growth and amniotic fluid level appears appropriate for her gestational age.  A biophysical profile performed today was 6 out of 8.  She received a -2 for fetal movements that did not meet criteria.  She subsequently had a reactive nonstress test making her total biophysical profile score 8 out of 10.  As her diastolic blood pressures were all greater than 100 today, delivery is recommended.    The patient will go home to pick up her bags and will return to the hospital later this afternoon for delivery.  A total of 15 minutes was spent counseling and coordinating the care for this patient.  Greater than 50% of the time was spent in direct face-to-face contact.

## 2021-07-30 ENCOUNTER — Encounter (HOSPITAL_COMMUNITY)
Admission: AD | Disposition: A | Discharge status: Home / Self Care | Payer: Self-pay | Source: Home / Self Care | Attending: Family Medicine

## 2021-07-30 ENCOUNTER — Encounter (HOSPITAL_COMMUNITY): Payer: Self-pay | Admitting: Family Medicine

## 2021-07-30 ENCOUNTER — Inpatient Hospital Stay (HOSPITAL_COMMUNITY): Payer: Medicaid Other | Admitting: Anesthesiology

## 2021-07-30 DIAGNOSIS — O1414 Severe pre-eclampsia complicating childbirth: Secondary | ICD-10-CM

## 2021-07-30 DIAGNOSIS — O114 Pre-existing hypertension with pre-eclampsia, complicating childbirth: Secondary | ICD-10-CM

## 2021-07-30 DIAGNOSIS — Z3A37 37 weeks gestation of pregnancy: Secondary | ICD-10-CM

## 2021-07-30 DIAGNOSIS — Z98891 History of uterine scar from previous surgery: Secondary | ICD-10-CM

## 2021-07-30 DIAGNOSIS — O99820 Streptococcus B carrier state complicating pregnancy: Secondary | ICD-10-CM

## 2021-07-30 LAB — CBC
HCT: 35.6 % — ABNORMAL LOW (ref 36.0–46.0)
Hematocrit: 37.9 % (ref 34.0–46.6)
Hemoglobin: 12 g/dL (ref 11.1–15.9)
Hemoglobin: 11.3 g/dL — ABNORMAL LOW (ref 12.0–15.0)
MCH: 23.6 pg — ABNORMAL LOW (ref 26.6–33.0)
MCH: 24.1 pg — ABNORMAL LOW (ref 26.0–34.0)
MCHC: 31.7 g/dL (ref 31.5–35.7)
MCHC: 31.7 g/dL (ref 30.0–36.0)
MCV: 75 fL — ABNORMAL LOW (ref 79–97)
MCV: 75.9 fL — ABNORMAL LOW (ref 80.0–100.0)
Platelets: 403 10*3/uL (ref 150–450)
Platelets: 387 10*3/uL (ref 150–400)
RBC: 5.09 x10E6/uL (ref 3.77–5.28)
RBC: 4.69 MIL/uL (ref 3.87–5.11)
RDW: 16.6 % — ABNORMAL HIGH (ref 11.7–15.4)
RDW: 17.1 % — ABNORMAL HIGH (ref 11.5–15.5)
WBC: 10.6 10*3/uL (ref 3.4–10.8)
WBC: 9 10*3/uL (ref 4.0–10.5)
nRBC: 0 % (ref 0.0–0.2)

## 2021-07-30 LAB — RPR: RPR Ser Ql: NONREACTIVE

## 2021-07-30 LAB — RESP PANEL BY RT-PCR (FLU A&B, COVID) ARPGX2
Influenza A by PCR: NEGATIVE
Influenza B by PCR: NEGATIVE
SARS Coronavirus 2 by RT PCR: NEGATIVE

## 2021-07-30 LAB — PROTEIN / CREATININE RATIO, URINE
Creatinine, Urine: 219.4 mg/dL
Creatinine, Urine: 329.68 mg/dL
Protein Creatinine Ratio: 0.09 mg/mg{Cre} (ref 0.00–0.15)
Protein, Ur: 29.6 mg/dL
Protein/Creat Ratio: 135 mg/g creat (ref 0–200)
Total Protein, Urine: 29 mg/dL

## 2021-07-30 LAB — COMPREHENSIVE METABOLIC PANEL
ALT: 6 IU/L (ref 0–32)
ALT: 10 U/L (ref 0–44)
AST: 15 IU/L (ref 0–40)
AST: 17 U/L (ref 15–41)
Albumin/Globulin Ratio: 0.8 — ABNORMAL LOW (ref 1.2–2.2)
Albumin: 3.3 g/dL — ABNORMAL LOW (ref 3.9–5.0)
Albumin: 2.3 g/dL — ABNORMAL LOW (ref 3.5–5.0)
Alkaline Phosphatase: 236 IU/L — ABNORMAL HIGH (ref 44–121)
Alkaline Phosphatase: 169 U/L — ABNORMAL HIGH (ref 38–126)
Anion gap: 12 (ref 5–15)
BUN/Creatinine Ratio: 12 (ref 9–23)
BUN: 8 mg/dL (ref 6–20)
BUN: 8 mg/dL (ref 6–20)
Bilirubin Total: 0.3 mg/dL (ref 0.0–1.2)
CO2: 19 mmol/L — ABNORMAL LOW (ref 20–29)
CO2: 20 mmol/L — ABNORMAL LOW (ref 22–32)
Calcium: 9.5 mg/dL (ref 8.7–10.2)
Calcium: 9 mg/dL (ref 8.9–10.3)
Chloride: 102 mmol/L (ref 96–106)
Chloride: 101 mmol/L (ref 98–111)
Creatinine, Ser: 0.67 mg/dL (ref 0.57–1.00)
Creatinine, Ser: 0.71 mg/dL (ref 0.44–1.00)
GFR, Estimated: >60 mL/min (ref >60)
Globulin, Total: 3.9 g/dL (ref 1.5–4.5)
Glucose, Bld: 81 mg/dL (ref 70–99)
Glucose: 87 mg/dL (ref 65–99)
Potassium: 4.5 mmol/L (ref 3.5–5.2)
Potassium: 3.8 mmol/L (ref 3.5–5.1)
Sodium: 134 mmol/L (ref 134–144)
Sodium: 133 mmol/L — ABNORMAL LOW (ref 135–145)
Total Bilirubin: 0.4 mg/dL (ref 0.3–1.2)
Total Protein: 7.2 g/dL (ref 6.0–8.5)
Total Protein: 6.6 g/dL (ref 6.5–8.1)
eGFR: 124 mL/min/{1.73_m2} (ref >59)

## 2021-07-30 LAB — TYPE AND SCREEN
ABO/RH(D): O POS
Antibody Screen: NEGATIVE

## 2021-07-30 SURGERY — Surgical Case
Anesthesia: Spinal

## 2021-07-30 MED ORDER — OXYCODONE HCL 5 MG/5ML PO SOLN: 5.0000 mg | Freq: Once | ORAL | Status: DC | PRN

## 2021-07-30 MED ORDER — MAGNESIUM SULFATE BOLUS VIA INFUSION
4.0000 g | Freq: Once | INTRAVENOUS | Status: AC
  Administered 2021-07-30: 4 g via INTRAVENOUS
  Filled 2021-07-30: qty 1000

## 2021-07-30 MED ORDER — NALBUPHINE HCL 10 MG/ML IJ SOLN: 5.0000 mg | INTRAMUSCULAR | Status: DC | PRN

## 2021-07-30 MED ORDER — SCOPOLAMINE 1 MG/3DAYS TD PT72
1.0000 | MEDICATED_PATCH | Freq: Once | TRANSDERMAL | Status: AC
  Administered 2021-07-30: 1.5 mg via TRANSDERMAL

## 2021-07-30 MED ORDER — PHENYLEPHRINE HCL-NACL 20-0.9 MG/250ML-% IV SOLN
INTRAVENOUS | Status: AC
  Filled 2021-07-30: qty 250

## 2021-07-30 MED ORDER — DEXAMETHASONE SODIUM PHOSPHATE 4 MG/ML IJ SOLN
INTRAMUSCULAR | Status: AC
  Filled 2021-07-30: qty 1

## 2021-07-30 MED ORDER — DEXAMETHASONE SODIUM PHOSPHATE 4 MG/ML IJ SOLN
INTRAMUSCULAR | Status: DC | PRN
  Administered 2021-07-30: 4 mg via INTRAVENOUS

## 2021-07-30 MED ORDER — MEPERIDINE HCL 25 MG/ML IJ SOLN
INTRAMUSCULAR | Status: DC | PRN
  Administered 2021-07-30: 12.5 mg via INTRAVENOUS

## 2021-07-30 MED ORDER — NALBUPHINE HCL 10 MG/ML IJ SOLN: 5.0000 mg | Freq: Once | INTRAMUSCULAR | Status: DC | PRN

## 2021-07-30 MED ORDER — NALOXONE HCL 0.4 MG/ML IJ SOLN: 0.4000 mg | INTRAMUSCULAR | Status: DC | PRN

## 2021-07-30 MED ORDER — HYDROMORPHONE HCL 1 MG/ML IJ SOLN: 0.2500 mg | INTRAMUSCULAR | Status: DC | PRN

## 2021-07-30 MED ORDER — ACETAMINOPHEN 500 MG PO TABS
1000.0000 mg | ORAL_TABLET | Freq: Four times a day (QID) | ORAL | Status: AC
  Administered 2021-07-30 – 2021-07-31 (×3): 1000 mg via ORAL
  Filled 2021-07-30 (×2): qty 2

## 2021-07-30 MED ORDER — ACETAMINOPHEN 500 MG PO TABS
1000.0000 mg | ORAL_TABLET | Freq: Once | ORAL | Status: AC
  Administered 2021-07-30: 1000 mg via ORAL
  Filled 2021-07-30: qty 2

## 2021-07-30 MED ORDER — KETOROLAC TROMETHAMINE 30 MG/ML IJ SOLN
30.0000 mg | Freq: Once | INTRAMUSCULAR | Status: AC | PRN
  Administered 2021-07-30: 30 mg via INTRAVENOUS

## 2021-07-30 MED ORDER — PENICILLIN G POT IN DEXTROSE 60000 UNIT/ML IV SOLN
3.0000 10*6.[IU] | INTRAVENOUS | Status: DC
  Administered 2021-07-30 (×2): 3 10*6.[IU] via INTRAVENOUS
  Filled 2021-07-30 (×2): qty 50

## 2021-07-30 MED ORDER — DEXTROSE 5 % IV SOLN
INTRAVENOUS | Status: DC | PRN
  Administered 2021-07-30: 3 g via INTRAVENOUS

## 2021-07-30 MED ORDER — NIFEDIPINE ER OSMOTIC RELEASE 60 MG PO TB24
90.0000 mg | ORAL_TABLET | Freq: Every day | ORAL | Status: DC
  Administered 2021-07-31 – 2021-08-02 (×2): 90 mg via ORAL
  Filled 2021-07-30 (×4): qty 1

## 2021-07-30 MED ORDER — LABETALOL HCL 5 MG/ML IV SOLN: 80.0000 mg | INTRAVENOUS | Status: DC | PRN

## 2021-07-30 MED ORDER — LABETALOL HCL 5 MG/ML IV SOLN
20.0000 mg | INTRAVENOUS | Status: DC | PRN
  Administered 2021-07-30: 20 mg via INTRAVENOUS
  Filled 2021-07-30: qty 4

## 2021-07-30 MED ORDER — SCOPOLAMINE 1 MG/3DAYS TD PT72
MEDICATED_PATCH | TRANSDERMAL | Status: AC
  Filled 2021-07-30: qty 1

## 2021-07-30 MED ORDER — KETOROLAC TROMETHAMINE 30 MG/ML IJ SOLN
30.0000 mg | Freq: Four times a day (QID) | INTRAMUSCULAR | Status: AC | PRN
  Administered 2021-07-31: 30 mg via INTRAVENOUS
  Filled 2021-07-30: qty 1

## 2021-07-30 MED ORDER — MEPERIDINE HCL 25 MG/ML IJ SOLN: 6.2500 mg | INTRAMUSCULAR | Status: DC | PRN

## 2021-07-30 MED ORDER — MORPHINE SULFATE (PF) 0.5 MG/ML IJ SOLN
INTRAMUSCULAR | Status: DC | PRN
  Administered 2021-07-30: .15 ug via INTRATHECAL

## 2021-07-30 MED ORDER — LABETALOL HCL 5 MG/ML IV SOLN: 40.0000 mg | INTRAVENOUS | Status: DC | PRN

## 2021-07-30 MED ORDER — DIPHENHYDRAMINE HCL 25 MG PO CAPS: 25.0000 mg | ORAL_CAPSULE | ORAL | Status: DC | PRN

## 2021-07-30 MED ORDER — STERILE WATER FOR IRRIGATION IR SOLN
Status: DC | PRN
  Administered 2021-07-30: 1000 mL

## 2021-07-30 MED ORDER — NALBUPHINE HCL 10 MG/ML IJ SOLN
5.0000 mg | Freq: Once | INTRAMUSCULAR | Status: DC | PRN
Start: 2021-07-30 — End: 2021-08-02

## 2021-07-30 MED ORDER — PHENYLEPHRINE 40 MCG/ML (10ML) SYRINGE FOR IV PUSH (FOR BLOOD PRESSURE SUPPORT)
PREFILLED_SYRINGE | INTRAVENOUS | Status: AC
  Filled 2021-07-30: qty 10

## 2021-07-30 MED ORDER — CEFAZOLIN IN SODIUM CHLORIDE 3-0.9 GM/100ML-% IV SOLN
INTRAVENOUS | Status: AC
  Filled 2021-07-30: qty 100

## 2021-07-30 MED ORDER — SODIUM CHLORIDE 0.9% FLUSH: 3.0000 mL | INTRAVENOUS | Status: DC | PRN

## 2021-07-30 MED ORDER — OXYTOCIN-SODIUM CHLORIDE 30-0.9 UT/500ML-% IV SOLN
INTRAVENOUS | Status: DC | PRN
  Administered 2021-07-30: 300 mL via INTRAVENOUS

## 2021-07-30 MED ORDER — KETOROLAC TROMETHAMINE 30 MG/ML IJ SOLN
INTRAMUSCULAR | Status: AC
  Filled 2021-07-30: qty 1

## 2021-07-30 MED ORDER — SODIUM CHLORIDE 0.9 % IV SOLN
5.0000 10*6.[IU] | Freq: Once | INTRAVENOUS | Status: AC
  Administered 2021-07-30: 5 10*6.[IU] via INTRAVENOUS
  Filled 2021-07-30: qty 5

## 2021-07-30 MED ORDER — PHENYLEPHRINE HCL (PRESSORS) 10 MG/ML IV SOLN
INTRAVENOUS | Status: DC | PRN
  Administered 2021-07-30 (×2): 120 ug via INTRAVENOUS
  Administered 2021-07-30: 80 ug via INTRAVENOUS

## 2021-07-30 MED ORDER — MORPHINE SULFATE (PF) 0.5 MG/ML IJ SOLN
INTRAMUSCULAR | Status: AC
  Filled 2021-07-30: qty 10

## 2021-07-30 MED ORDER — DIPHENHYDRAMINE HCL 50 MG/ML IJ SOLN: 12.5000 mg | INTRAMUSCULAR | Status: DC | PRN

## 2021-07-30 MED ORDER — KETOROLAC TROMETHAMINE 30 MG/ML IJ SOLN: 30.0000 mg | Freq: Four times a day (QID) | INTRAMUSCULAR | Status: AC | PRN

## 2021-07-30 MED ORDER — ONDANSETRON HCL 4 MG/2ML IJ SOLN: 4.0000 mg | Freq: Three times a day (TID) | INTRAMUSCULAR | Status: DC | PRN

## 2021-07-30 MED ORDER — HYDRALAZINE HCL 20 MG/ML IJ SOLN: 10.0000 mg | INTRAMUSCULAR | Status: DC | PRN

## 2021-07-30 MED ORDER — LACTATED RINGERS IV SOLN: INTRAVENOUS | Status: DC

## 2021-07-30 MED ORDER — MAGNESIUM SULFATE 40 GM/1000ML IV SOLN
2.0000 g/h | INTRAVENOUS | Status: AC
  Administered 2021-07-30 (×2): 2 g/h via INTRAVENOUS
  Filled 2021-07-30 (×2): qty 1000

## 2021-07-30 MED ORDER — ONDANSETRON HCL 4 MG/2ML IJ SOLN
INTRAMUSCULAR | Status: DC | PRN
  Administered 2021-07-30: 4 mg via INTRAVENOUS

## 2021-07-30 MED ORDER — SODIUM CHLORIDE 0.9 % IR SOLN
Status: DC | PRN
  Administered 2021-07-30: 1000 mL

## 2021-07-30 MED ORDER — FENTANYL CITRATE (PF) 100 MCG/2ML IJ SOLN
INTRAMUSCULAR | Status: AC
  Filled 2021-07-30: qty 2

## 2021-07-30 MED ORDER — OXYCODONE HCL 5 MG PO TABS: 5.0000 mg | ORAL_TABLET | Freq: Once | ORAL | Status: DC | PRN

## 2021-07-30 MED ORDER — MEPERIDINE HCL 25 MG/ML IJ SOLN
INTRAMUSCULAR | Status: AC
  Filled 2021-07-30: qty 1

## 2021-07-30 MED ORDER — NALOXONE HCL 4 MG/10ML IJ SOLN
1.0000 ug/kg/h | INTRAVENOUS | Status: DC | PRN
  Filled 2021-07-30: qty 5

## 2021-07-30 MED ORDER — ONDANSETRON HCL 4 MG/2ML IJ SOLN
INTRAMUSCULAR | Status: AC
  Filled 2021-07-30: qty 2

## 2021-07-30 MED ORDER — BUPIVACAINE IN DEXTROSE 0.75-8.25 % IT SOLN
INTRATHECAL | Status: AC
  Filled 2021-07-30: qty 2

## 2021-07-30 MED ORDER — FENTANYL CITRATE (PF) 100 MCG/2ML IJ SOLN
INTRAMUSCULAR | Status: DC | PRN
  Administered 2021-07-30: 15 ug via INTRATHECAL

## 2021-07-30 MED ORDER — BUPIVACAINE IN DEXTROSE 0.75-8.25 % IT SOLN
INTRATHECAL | Status: DC | PRN
  Administered 2021-07-30: 2 mL via INTRATHECAL

## 2021-07-30 MED ORDER — PHENYLEPHRINE HCL-NACL 20-0.9 MG/250ML-% IV SOLN
INTRAVENOUS | Status: DC | PRN
  Administered 2021-07-30: 90 ug/min via INTRAVENOUS
  Administered 2021-07-30: 60 ug/min via INTRAVENOUS
  Administered 2021-07-30: 30 ug/min via INTRAVENOUS

## 2021-07-30 MED ORDER — PROMETHAZINE HCL 25 MG/ML IJ SOLN: 6.2500 mg | INTRAMUSCULAR | Status: DC | PRN

## 2021-07-30 SURGICAL SUPPLY — 29 items
APL SKNCLS STERI-STRIP NONHPOA (GAUZE/BANDAGES/DRESSINGS) ×1
BENZOIN TINCTURE PRP APPL 2/3 (GAUZE/BANDAGES/DRESSINGS) ×2 IMPLANT
CHLORAPREP W/TINT 26ML (MISCELLANEOUS) ×2 IMPLANT
CLIP FILSHIE TUBAL LIGA STRL (Clip) IMPLANT
CLOTH BEACON ORANGE TIMEOUT ST (SAFETY) ×2 IMPLANT
DRESSING PREVENA PLUS CUSTOM (GAUZE/BANDAGES/DRESSINGS) ×1 IMPLANT
DRSG OPSITE POSTOP 4X10 (GAUZE/BANDAGES/DRESSINGS) ×2 IMPLANT
DRSG PREVENA PLUS CUSTOM (GAUZE/BANDAGES/DRESSINGS) ×2
ELECT REM PT RETURN 9FT ADLT (ELECTROSURGICAL) ×2
ELECTRODE REM PT RTRN 9FT ADLT (ELECTROSURGICAL) ×1 IMPLANT
GLOVE BIOGEL PI IND STRL 7.0 (GLOVE) ×3 IMPLANT
GLOVE BIOGEL PI INDICATOR 7.0 (GLOVE) ×3
GLOVE ECLIPSE 6.5 STRL STRAW (GLOVE) ×2 IMPLANT
GOWN STRL REUS W/ TWL LRG LVL3 (GOWN DISPOSABLE) ×2 IMPLANT
GOWN STRL REUS W/TWL LRG LVL3 (GOWN DISPOSABLE) ×4
NS IRRIG 1000ML POUR BTL (IV SOLUTION) ×2 IMPLANT
PAD OB MATERNITY 4.3X12.25 (PERSONAL CARE ITEMS) ×2 IMPLANT
PAD PREP 24X48 CUFFED NSTRL (MISCELLANEOUS) ×2 IMPLANT
RETRACTOR WND ALEXIS 25 LRG (MISCELLANEOUS) IMPLANT
RTRCTR WOUND ALEXIS 25CM LRG (MISCELLANEOUS)
STRIP CLOSURE SKIN 1/2X4 (GAUZE/BANDAGES/DRESSINGS) ×2 IMPLANT
SUT PLAIN 2 0 XLH (SUTURE) ×2 IMPLANT
SUT VIC AB 0 CT1 36 (SUTURE) ×4 IMPLANT
SUT VIC AB 2-0 CT1 27 (SUTURE) ×2
SUT VIC AB 2-0 CT1 TAPERPNT 27 (SUTURE) ×1 IMPLANT
SUT VIC AB 4-0 KS 27 (SUTURE) ×2 IMPLANT
TOWEL OR 17X24 6PK STRL BLUE (TOWEL DISPOSABLE) ×6 IMPLANT
TRAY FOLEY CATH SILVER 16FR (SET/KITS/TRAYS/PACK) ×2 IMPLANT
WATER STERILE IRR 1000ML POUR (IV SOLUTION) ×2 IMPLANT

## 2021-07-30 NOTE — Progress Notes (Signed)
Lauren Morales is a 25 y.o. G1P0 at [redacted]w[redacted]d admitted for induction of labor due to [facility].  Subjective: Patient reports she feels ok, no headaches. Reports pressure from foley balloon insertion has improved  Objective: BP (!) 125/91   Pulse 75   Temp 97.7 F (36.5 C)   Resp (!) 21   Ht 5\' 4"  (1.626 m)   Wt (!) 151 kg   LMP 11/04/2020 (Approximate)   SpO2 98%   BMI 57.16 kg/m  I/O last 3 completed shifts: In: 1754.8 [P.O.:120; I.V.:1384.8; IV Piggyback:250] Out: 200 [Urine:200] Total I/O In: 991.3 [I.V.:941.3; IV Piggyback:50] Out: 925 [Urine:925]  FHT:  FHR: 150 bpm, variability: minimal ,  accelerations:  Abscent,  decelerations:  Present - getting fluid bolus UC:   unable to trace on toco, not ruptured and has FB in so no internal monitors SVE:   Dilation: 4 Effacement (%): 50 Station: -1 Exam by:: [email or web address], RNC  Labs: Lab Results  Component Value Date   WBC 9.0 07/29/2021   HGB 11.3 (L) 07/29/2021   HCT 35.6 (L) 07/29/2021   MCV 75.9 (L) 07/29/2021   PLT 387 07/29/2021    Assessment / Plan: Induction of labor due to gHTN, foley bulb in place, Cat II tracing with decelerations and minimal variability.Will monitor strip and if continues to be remote from delivery and having cat II tracing and minimal variability without evidence of improvement and resuscitation with IV fluids and repositioning will discuss regarding recommendation for cesarean section.  Preeclampsia:  on magnesium sulfate Fetal Wellbeing:  Category II I/D:   GBS positive Anticipated MOD:  NSVD  07/31/2021 07/30/2021, 11:22 AM

## 2021-07-30 NOTE — Anesthesia Procedure Notes (Signed)
Spinal  Patient location during procedure: OR Start time: 07/30/2021 11:45 AM End time: 07/30/2021 11:54 AM Reason for block: surgical anesthesia Staffing Performed: anesthesiologist  Anesthesiologist: Lannie Fields, DO Preanesthetic Checklist Completed: patient identified, IV checked, risks and benefits discussed, surgical consent, monitors and equipment checked, pre-op evaluation and timeout performed Spinal Block Patient position: sitting Prep: DuraPrep and site prepped and draped Patient monitoring: cardiac monitor, continuous pulse ox and blood pressure Approach: midline Location: L3-4 Injection technique: single-shot Needle Needle type: Pencan  Needle gauge: 24 G Needle length: 9 cm Assessment Sensory level: T6 Events: CSF return Additional Notes CSF at 9cm+++

## 2021-07-30 NOTE — Anesthesia Postprocedure Evaluation (Signed)
Anesthesia Post Note  Patient: Lauren Morales  Procedure(s) Performed: CESAREAN SECTION     Patient location during evaluation: PACU Anesthesia Type: Spinal Level of consciousness: awake and alert and oriented Pain management: pain level controlled Vital Signs Assessment: post-procedure vital signs reviewed and stable Respiratory status: spontaneous breathing, nonlabored ventilation and respiratory function stable Cardiovascular status: blood pressure returned to baseline and stable Postop Assessment: no headache, no backache, spinal receding and no apparent nausea or vomiting Anesthetic complications: no   No notable events documented.  Last Vitals:  Vitals:   07/30/21 1415 07/30/21 1430  BP: 117/73 116/76  Pulse: 75 81  Resp: 18 20  Temp:    SpO2: 96% 92%    Last Pain:  Vitals:   07/30/21 1430  TempSrc:   PainSc: 0-No pain   Pain Goal: Patients Stated Pain Goal: 0 (07/29/21 2208)  LLE Motor Response: Purposeful movement (07/30/21 1415) LLE Sensation: Numbness (07/30/21 1415) RLE Motor Response: Purposeful movement (07/30/21 1415) RLE Sensation: Numbness (07/30/21 1415)        Lannie Fields

## 2021-07-30 NOTE — Anesthesia Preprocedure Evaluation (Signed)
Anesthesia Evaluation  Patient identified by MRN, date of birth, ID band Patient awake    Reviewed: Allergy & Precautions, NPO status , Patient's Chart, lab work & pertinent test results  Airway Mallampati: III  TM Distance: >3 FB Neck ROM: Full    Dental no notable dental hx.    Pulmonary neg pulmonary ROS,    Pulmonary exam normal breath sounds clear to auscultation       Cardiovascular hypertension, Pt. on medications Normal cardiovascular exam Rhythm:Regular Rate:Normal     Neuro/Psych negative neurological ROS  negative psych ROS   GI/Hepatic negative GI ROS, Neg liver ROS,   Endo/Other  Morbid obesitySuper morbid obesity BMI 57  Renal/GU negative Renal ROS  negative genitourinary   Musculoskeletal negative musculoskeletal ROS (+)   Abdominal (+) + obese,   Peds negative pediatric ROS (+)  Hematology  (+) Blood dyscrasia, anemia , hct 35.6, plt 387   Anesthesia Other Findings   Reproductive/Obstetrics (+) Pregnancy                             Anesthesia Physical Anesthesia Plan  ASA: 3  Anesthesia Plan: Spinal   Post-op Pain Management:    Induction:   PONV Risk Score and Plan: 3 and Ondansetron, Dexamethasone and Treatment may vary due to age or medical condition  Airway Management Planned: Natural Airway and Nasal Cannula  Additional Equipment: None  Intra-op Plan:   Post-operative Plan:   Informed Consent: I have reviewed the patients History and Physical, chart, labs and discussed the procedure including the risks, benefits and alternatives for the proposed anesthesia with the patient or authorized representative who has indicated his/her understanding and acceptance.       Plan Discussed with: CRNA  Anesthesia Plan Comments:         Anesthesia Quick Evaluation

## 2021-07-30 NOTE — Progress Notes (Signed)
Notified by RN that patient had 2 severe range pressures > 160/110. Suspect superimposed preeclampsia. Discussed case with Dr. Vergie Living who agreed with starting magnesium. IV Labetalol and Hydralazine ordered for BP management PRN per protocol. Discussed plan of care with patient who voiced understanding and is agreeable to current recommendations.  Evalina Field, MD  OB Fellow  Faculty Practice

## 2021-07-30 NOTE — Progress Notes (Signed)
Patient had late decels on monitor.  Nurse attempted to reposition patient and difficulty finding baby HR on monitor initially.  Called Dr Vergie Living given concern with late decelerations and difficulty finding fetal heart rate.   Nurses able to find baby heart rate on monitor with repositioning and late decels resolved.    Jovita Kussmaul, MD

## 2021-07-30 NOTE — Transfer of Care (Signed)
Immediate Anesthesia Transfer of Care Note  Patient: Lauren Morales  Procedure(s) Performed: CESAREAN SECTION  Patient Location: PACU  Anesthesia Type:Spinal  Level of Consciousness: awake  Airway & Oxygen Therapy: Patient Spontanous Breathing  Post-op Assessment: Report given to RN  Post vital signs: Reviewed and stable  Last Vitals:  Vitals Value Taken Time  BP 118/65 07/30/21 1347  Temp    Pulse 81 07/30/21 1350  Resp 24 07/30/21 1350  SpO2 97 % 07/30/21 1350  Vitals shown include unvalidated device data.  Last Pain:  Vitals:   07/30/21 0731  TempSrc: Oral  PainSc: 6       Patients Stated Pain Goal: 0 (07/29/21 2208)  Complications: No notable events documented.

## 2021-07-30 NOTE — Op Note (Addendum)
Attestation of Attending Supervision of OB Fellow: Evaluation and management procedures were performed by the Family Medicine OB Fellow under my supervision.  I have reviewed the Fellow's note and chart. I was gloved and gowned for the entirety of the procedure and involved during the case.   Indications for primary term singleton C-section: Fetal intolerance of labor with repetitive late decelerations remote from delivery, 2 cm dilated  Other diagnoses: Class III Obesity (BMI 57) cHTN with Superimposed Preeclampsia with severe features  Lauren Flake, MD, MPH, ABFM Attending Physician Center for [facility]  ____________________________________________ Operative Note   Patient: Lauren Morales  Date of Procedure: 07/30/2021  Procedure: Primary Low Transverse Cesarean   Indications: non-reassuring fetal status and remote from delivery  Pre-operative Diagnosis: fetal heart rate indication.   Post-operative Diagnosis: Same  TOLAC Candidate: Yes   Surgeon: Surgeon(s) and Role:    * Lauren Flake, MD - Primary    * Lauren Mccreedy, MD - Assisting    An experienced assistant was required given the standard of surgical care given the complexity of the case.  This assistant was needed for exposure, dissection, suctioning, retraction, instrument exchange, assisting with delivery with administration of fundal pressure, and for overall help during the procedure.   Anesthesia: spinal  Anesthesiologist:  Lauren Petties MD  Antibiotics: Cefazolin 3g   Estimated Blood Loss: 289 ml   Total IV Fluids: 500 ml  Urine Output:  225 cc OF clear urine   Complications: no complications   Indications: Lauren Morales is a 25 y.o. G1P1001 with an IUP [redacted]w[redacted]d presenting for unscheduled, urgent cesarean secondary to the indications listed above. Clinical course notable for IOL for cHTN si PreE with severe features on Mg, nrFHT .  The risks of cesarean section discussed with  the patient included but were not limited to: bleeding which may require transfusion or reoperation; infection which may require antibiotics; injury to bowel, bladder, ureters or other surrounding organs; injury to the fetus; need for additional procedures including hysterectomy in the event of a life-threatening hemorrhage; placental abnormalities with subsequent pregnancies, incisional problems, thromboembolic phenomenon and other postoperative/anesthesia complications. The patient concurred with the proposed plan, giving informed written consent for the procedure. Patient NPO status waived given urgency of case. Anesthesia and OR aware. Preoperative prophylactic antibiotics and SCDs ordered on call to the OR.   Findings: Viable infant in cephalic presentation, no nuchal cord present. Apgars 8 , 9. Weight 2900 g . Clear amniotic fluid. Normal placenta, three vessel cord. Normal uterus, Normal bilateral fallopian tubes, Normal bilateral ovaries. Cord gas= pH 7.19  Procedure Details: A Time Out was held and the above information confirmed. The patient received intravenous antibiotics and had sequential compression devices applied to her lower extremities preoperatively. The patient was taken back to the operative suite where spinal anesthesia was administered. After induction of anesthesia, the patient was draped and prepped in the usual sterile manner and placed in a dorsal supine position with a leftward tilt.  Traxis was placed to help support pannus. A low transverse skin incision was made with scalpel and carried down through the subcutaneous tissue to the fascia. Fascial incision was made and extended transversely. The fascia was separated from the underlying rectus tissue superiorly and inferiorly. The rectus muscles were separated in the midline bluntly and the peritoneum was entered bluntly. An Alexis retractor was placed to aid in visualization of the uterus. The utero-vesical peritoneal reflection was  incised transversely and the bladder flap was  bluntly freed from the lower uterine segment. A low transverse uterine incision was made. The infant was successfully delivered from cephalic presentation, the umbilical cord was clamped immediately. Cord ph was not sent, and cord blood was obtained for evaluation. The placenta was removed Intact and appeared normal. The uterine incision was closed with running locked sutures of 0-Vicryl, and then a second imbricating layer was also placed with 0-Vicryl. Overall, excellent hemostasis was noted. The abdomen and the pelvis were cleared of all clot and debris and the Jon Gills was removed. Hemostasis was confirmed on all surfaces.  The peritoneum was reapproximated using 2-0 vicryl . The fascia was then closed using 0 Vicryl in a running fashion. The subcutaneous layer was reapproximated with plain gut and the skin was closed with a 4-0 vicryl subcuticular stitch. The patient tolerated the procedure well. Sponge, lap, instrument and needle counts were correct x 2.  Provena wound vac was placed in the OR to prevent surgical site infection. She was taken to the recovery room in stable condition.  Disposition: PACU - hemodynamically stable.    Signed: Warner Mccreedy, MD, MPH OB Fellow, Faculty Practice Center for [facility] (Faculty Practice)  Signed: Federico Flake, MD, MPH, ABFM, [facility] Attending Physician Center for [facility]

## 2021-07-30 NOTE — Lactation Note (Signed)
This note was copied from a baby's chart. Lactation Consultation Note  Patient Name: Lauren Morales [id number] Date: 07/30/2021 Reason for consult: Initial assessment;Early term 37-38.6wks Age:25 hours  Mom's feeding choice is Breast/formula feeding. Baby is sleepy. Placed baby to the breast. Mom has flat very compressible nipples. Hand expressed a few drops of colostrum and spoon fed baby for stimulation to wake up to feed. Football position was good for latching. Baby just doesn't have any interest in latching at this time.  Discussed pumping mom in agreement. Mom shown how to use DEBP & how to disassemble, clean, & reassemble parts. Mom knows to pump q3h for 15-20 min.  Mom pumped w/no collection at this time. Mom encouraged to feed baby 8-12 times/24 hours and with feeding cues.    Lactation brochure given.  Maternal Data Has patient been taught Hand Expression?: Yes  Feeding Nipple Type: Slow - flow  LATCH Score Latch: Too sleepy or reluctant, no latch achieved, no sucking elicited.  Audible Swallowing: None  Type of Nipple: Flat  Comfort (Breast/Nipple): Soft / non-tender  Hold (Positioning): Assistance needed to correctly position infant at breast and maintain latch.  LATCH Score: 4   Lactation Tools Discussed/Used Tools: Shells;Pump Breast pump type: Double-Electric Breast Pump Pump Education: Setup, frequency, and cleaning;Milk Storage Reason for Pumping: flat nipples/supplementation Pumping frequency: q 3hr Pumped volume: 0 mL  Interventions Interventions: Breast feeding basics reviewed;Support pillows;Assisted with latch;Position options;Pace feeding;Skin to skin;Expressed milk;Breast massage;Hand express;Shells;Pre-pump if needed;DEBP;Breast compression;Adjust position;Education  Discharge WIC Program: Yes  Consult Status Consult Status: Follow-up Date: 07/31/21 Follow-up type: In-patient    Charyl Dancer 07/30/2021, 8:34 PM

## 2021-07-30 NOTE — Progress Notes (Signed)
Primary C-section Consent  Lauren Morales is 25 y.o. G1P0 at [redacted]w[redacted]d admitted for IOL due to worsening CHTN with eventual diagnosis in labor of Superimposed Preeclampsia.   Indication: Fetal intolerance of labor, remote from delivery  Complications: Chronic HTN with superimposed Preeclampsia with severe features on magnesium, Class 3 Obesity with BMI Body mass index is 57.16 kg/m., Anemia (baseline HGB=11.3), GBS POS  The risks of cesarean section were discussed with the patient including but were not limited to: bleeding which may require transfusion or reoperation; infection which may require antibiotics; injury to bowel, bladder, ureters or other surrounding organs; injury to the fetus; need for additional procedures including hysterectomy in the event of a life-threatening hemorrhage; placental abnormalities wth subsequent pregnancies, incisional problems, thromboembolic phenomenon and other postoperative/anesthesia complications.   Patient has been NPO since arrival she will remain NPO for procedure. Anesthesia and OR aware.  Preoperative prophylactic antibiotics and SCDs ordered on call to the OR.  To OR when ready.  Counseled patient about eligibility of TOLAC with next pregnancy  Federico Flake, MD, MPH, ABFM, [facility] Attending Physician Center for [facility]

## 2021-07-30 NOTE — H&P (Signed)
OBSTETRIC ADMISSION HISTORY AND PHYSICAL  Lauren Morales is a 25 y.o. female G1P0 with IUP at [redacted]w[redacted]d by 5 week [place] presenting for IOL for BPP 6/8 and worsening chronic HTN. MFM recommended delivery at this time at her appointment earlier today. She reports +FMs, no LOF, no VB, no blurry vision, peripheral edema, or RUQ pain.  She currently has a mild headache that she has not taken anything for yet. She plans on breast feeding. She is undecided about birth control after delivery. She has been compliant with her Procardia 90 mg daily and takes it at noon every day.  She received her prenatal care at [facility].  Dating: By [place] --->  Estimated Date of Delivery: 08/15/21  Sono:   @[redacted]w[redacted]d , normal anatomy, cephalic presentation, posterior placental lie, 300 g, 62% EFW @[redacted]w[redacted]d , normal interval growth, BPP 6/8, EFW 2935 g, 30% EFW  Prenatal History/Complications:  CHTN (On Procardia 90 mg daily) Obesity GBS positive  Past Medical History: Past Medical History:  Diagnosis Date   Cholelithiasis 12/04/2020    Past Surgical History: Past Surgical History:  Procedure Laterality Date   WRIST SURGERY      Obstetrical History: OB History     Gravida  1   Para      Term      Preterm      AB      Living         SAB      IAB      Ectopic      Multiple      Live Births              Social History Social History   Socioeconomic History   Marital status: Single    Spouse name: Not on file   Number of children: Not on file   Years of education: Not on file   Highest education level: Not on file  Occupational History   Not on file  Tobacco Use   Smoking status: Never   Smokeless tobacco: Never  Vaping Use   Vaping Use: Never used  Substance and Sexual Activity   Alcohol use: Yes   Drug use: Not Currently   Sexual activity: Yes    Birth control/protection: None  Other Topics Concern   Not on file  Social History Narrative   Not on file   Social Determinants of  Health   Financial Resource Strain: Not on file  Food Insecurity: No Food Insecurity   Worried About Running Out of Food in the Last Year: Never true   Ran Out of Food in the Last Year: Never true  Transportation Needs: No Transportation Needs   Lack of Transportation (Medical): No   Lack of Transportation (Non-Medical): No  Physical Activity: Not on file  Stress: Not on file  Social Connections: Not on file    Family History: History reviewed. No pertinent family history.  Allergies: No Known Allergies  Medications Prior to Admission  Medication Sig Dispense Refill Last Dose   aspirin EC 81 MG tablet Take 1 tablet (81 mg total) by mouth daily. Take after 12 weeks for prevention of preeclampsia later in pregnancy 300 tablet 2 07/28/2021   ferrous sulfate 325 (65 FE) MG tablet Take 1 tablet (325 mg total) by mouth every other day. 30 tablet 3 07/28/2021   NIFEdipine (ADALAT CC) 90 MG 24 hr tablet Take 1 tablet (90 mg total) by mouth daily. 30 tablet 2 07/28/2021   Prenatal Vit-Fe Fumarate-FA (PRENATAL MULTIVITAMIN)  TABS tablet Take 1 tablet by mouth daily at 12 noon.   07/28/2021    Review of Systems  All systems reviewed and negative except as stated in HPI  Blood pressure (!) 144/98, pulse 86, temperature 98 F (36.7 C), resp. rate 16, height 5\' 4"  (1.626 m), weight (!) 151 kg, last menstrual period 11/04/2020. General appearance: alert, cooperative, and no distress Lungs: breathing comfortably on room air Heart: normal rate Abdomen: soft, non-tender, gravid Extremities: no LE edema  Presentation: cephalic Fetal monitoring: Baseline 150 bpm, moderate variability, + accels, no decels Uterine activity: Irregular contractions  Prenatal labs: ABO, Rh: --/--/PENDING (08/17 2324) Antibody: PENDING (08/17 2324) Rubella: 3.59 (02/07 1535) RPR: Non Reactive (06/03 0850)  HBsAg: Negative (02/07 1535)  HIV: Non Reactive (06/03 0850)  GBS: Positive/-- (08/09 1527)  1 hr Glucola  normal Genetic screening negative  Anatomy 01-31-1975 normal  Prenatal Transfer Tool  Maternal Diabetes: No Genetic Screening: Normal Maternal Ultrasounds/Referrals: Normal Fetal Ultrasounds or other Referrals:  Referred to Materal Fetal Medicine  Maternal Substance Abuse:  No Significant Maternal Medications:  Meds include: Other: ASA 81 mg, Procardia 90 mg Significant Maternal Lab Results: Group B Strep positive  Results for orders placed or performed during the hospital encounter of 07/29/21 (from the past 24 hour(s))  Urinalysis, Routine w reflex microscopic Urine, Clean Catch   Collection Time: 07/29/21 10:09 PM  Result Value Ref Range   Color, Urine AMBER (A) YELLOW   APPearance HAZY (A) CLEAR   Specific Gravity, Urine 1.030 1.005 - 1.030   pH 5.0 5.0 - 8.0   Glucose, UA NEGATIVE NEGATIVE mg/dL   Hgb urine dipstick NEGATIVE NEGATIVE   Bilirubin Urine NEGATIVE NEGATIVE   Ketones, ur NEGATIVE NEGATIVE mg/dL   Protein, ur NEGATIVE NEGATIVE mg/dL   Nitrite NEGATIVE NEGATIVE   Leukocytes,Ua SMALL (A) NEGATIVE   RBC / HPF 0-5 0 - 5 RBC/hpf   WBC, UA 11-20 0 - 5 WBC/hpf   Bacteria, UA MANY (A) NONE SEEN   Squamous Epithelial / LPF 11-20 0 - 5   Mucus PRESENT   Type and screen   Collection Time: 07/29/21 11:24 PM  Result Value Ref Range   ABO/RH(D) PENDING    Antibody Screen PENDING    Sample Expiration      08/01/2021,2359 Performed at [facility], [address] [place], [place]     Patient Active Problem List   Diagnosis Date Noted   Chronic hypertension affecting pregnancy 07/29/2021   GBS (group B Streptococcus carrier), +RV culture, currently pregnant 07/24/2021   BMI 50.0-59.9, adult (HCC) 03/16/2021   Maternal iron deficiency anemia affecting pregnancy, antepartum, first trimester 01/20/2021   Supervision of high-risk pregnancy 01/19/2021   Maternal morbid obesity, antepartum (HCC) 01/19/2021   Chronic hypertension complicating or reason for care  during pregnancy 01/19/2021    Assessment/Plan:  Lauren Morales is a 25 y.o. G1P0 at [redacted]w[redacted]d here for IOL in the setting of worsening cHTN and BPP 6/8.  #Labor: SVE on admission closed/thick/-3. Will start induction with Cytotec 50 mcg buccally and reassess in 4 hours. #Pain: PRN #FWB: Cat 1 #ID:  GBS positive; PCN ordered #MOF: Breast #MOC: Undecided  #Circ:  N/A  #Chronic HTN: Mild range BP on admission. Will continue Procardia 90 mg daily and closely monitor BP. PCR 0.09. CBC with Hgb 11.3/Plts 387. CMP ordered and pending. Treating headache with Tylenol 1000 mg. Will reassess for improvement.   8/8, MD  OB Fellow  Faculty Practice 07/30/2021, 12:00 AM

## 2021-07-30 NOTE — Discharge Summary (Signed)
Postpartum Discharge Summary  Date of Service updated 08/02/21     Patient Name: Lauren Morales DOB: [birth year] MRN: [id number]  Date of admission: 07/29/2021 Delivery date:07/30/2021  Delivering provider: Caren Macadam  Date of discharge: 08/02/2021  Admitting diagnosis: Chronic hypertension affecting pregnancy [O10.919] Intrauterine pregnancy: [redacted]w[redacted]d     Secondary diagnosis:  Active Problems:   Supervision of high-risk pregnancy   Chronic hypertension complicating or reason for care during pregnancy   Chronic hypertension affecting pregnancy   Status post primary low transverse cesarean section   S/P cesarean section  Additional problems: None    Discharge diagnosis: Term Pregnancy Delivered and Preeclampsia (severe)                                              Post partum procedures: None Augmentation: IP Foley Complications: None  Hospital course: Onset of Labor With Unplanned C/S   25 y.o. yo G1P1001 at [redacted]w[redacted]d was admitted for IOL for cHTN  with SIPE with SF on 07/29/2021. Patient had a labor course significant for started with Cytotec and had a foley bulb placed and ultimately dialted to 4cm however tracing non-reassuring with recurrent late decelerations with minimal variability remote for delivery. The patient went for cesarean section due to Non-Reassuring FHR. Delivery details as follows: Membrane Rupture Time/Date: 9:32 AM ,07/30/2021   Delivery Method:C-Section, Low Transverse  Details of operation can be found in separate operative note. Patient had an uncomplicated postpartum course. She received magnesium x 24 hours. Was started on Procardia for BP.  She is ambulating,tolerating a regular diet, passing flatus, and urinating well.  Patient is discharged home in stable condition 08/02/21.  Newborn Data: Birth date:07/30/2021  Birth time:12:34 PM  Gender:Female  Living status:Living  Apgars:7 ,9  Weight:2900 g   Magnesium Sulfate received: Yes: Seizure  prophylaxis BMZ received: No Rhophylac:No MMR:N/A T-DaP: declined Flu: N/A Transfusion:No  Physical exam  Vitals:   08/02/21 0413 08/02/21 0415 08/02/21 0834 08/02/21 0835  BP: (!) 130/92  (!) 143/82   Pulse: (!) 102  (!) 103   Resp: 20  19   Temp: 99.4 F (37.4 C)  98.5 F (36.9 C)   TempSrc: Oral  Oral   SpO2: 99% 98% 98% 96%  Weight:      Height:       General: alert Lochia: appropriate Uterine Fundus: firm Incision: Healing well with no significant drainage DVT Evaluation: No evidence of DVT seen on physical exam. Labs: Lab Results  Component Value Date   WBC 9.0 07/29/2021   HGB 11.3 (L) 07/29/2021   HCT 35.6 (L) 07/29/2021   MCV 75.9 (L) 07/29/2021   PLT 387 07/29/2021   CMP Latest Ref Rng & Units 07/29/2021  Glucose 70 - 99 mg/dL 81  BUN 6 - 20 mg/dL 8  Creatinine 0.44 - 1.00 mg/dL 0.71  Sodium 135 - 145 mmol/L 133(L)  Potassium 3.5 - 5.1 mmol/L 3.8  Chloride 98 - 111 mmol/L 101  CO2 22 - 32 mmol/L 20(L)  Calcium 8.9 - 10.3 mg/dL 9.0  Total Protein 6.5 - 8.1 g/dL 6.6  Total Bilirubin 0.3 - 1.2 mg/dL 0.4  Alkaline Phos 38 - 126 U/L 169(H)  AST 15 - 41 U/L 17  ALT 0 - 44 U/L 10   Edinburgh Score: Edinburgh Postnatal Depression Scale Screening Tool 08/01/2021  I have been able  to laugh and see the funny side of things. 0  I have looked forward with enjoyment to things. 0  I have blamed myself unnecessarily when things went wrong. 1  I have been anxious or worried for no good reason. 1  I have felt scared or panicky for no good reason. 0  Things have been getting on top of me. 1  I have been so unhappy that I have had difficulty sleeping. 0  I have felt sad or miserable. 0  I have been so unhappy that I have been crying. 1  The thought of harming myself has occurred to me. 0  Edinburgh Postnatal Depression Scale Total 4     After visit meds:  Allergies as of 08/02/2021   No Known Allergies      Medication List     STOP taking these  medications    ferrous sulfate 325 (65 FE) MG tablet       TAKE these medications    Acetaminophen Extra Strength 500 MG tablet Generic drug: acetaminophen Take 2 tablets (1,000 mg total) by mouth every 6 (six) hours.   aspirin EC 81 MG tablet Take 1 tablet (81 mg total) by mouth daily. Take after 12 weeks for prevention of preeclampsia later in pregnancy   furosemide 20 MG tablet Commonly known as: Lasix Take 1 tablet (20 mg total) by mouth 2 (two) times daily for 3 days.   ibuprofen 800 MG tablet Commonly known as: ADVIL Take 1 tablet (800 mg total) by mouth 3 (three) times daily with meals as needed for headache, moderate pain or cramping.   NIFEdipine 90 MG 24 hr tablet Commonly known as: ADALAT CC Take 1 tablet (90 mg total) by mouth daily.   oxyCODONE-acetaminophen 5-325 MG tablet Commonly known as: PERCOCET/ROXICET Take 1-2 tablets by mouth every 6 (six) hours as needed.   prenatal multivitamin Tabs tablet Take 1 tablet by mouth daily at 12 noon.               Discharge Care Instructions  (From admission, onward)           Start     Ordered   08/02/21 0000  Discharge wound care:       Comments: Pt will be seen in office on Tuesday for Provena removal   08/02/21 1048             Discharge home in stable condition Infant Feeding: Breast Infant Disposition:home with mother Discharge instruction: per After Visit Summary and Postpartum booklet. Activity: Advance as tolerated. Pelvic rest for 6 weeks.  Diet: routine diet Future Appointments: Future Appointments  Date Time Provider [place]  08/05/2021 10:00 AM [facility] NURSE [facility]  08/26/2021 10:55 AM Radene Gunning, MD [facility]   Follow up Visit:  [place] [facility]. Schedule an appointment as soon as possible for a visit in 1 week(s).   Specialty: Obstetrics and Gynecology Contact information: [place] [phone]               Message sent to [facility] by Dr. Cy Blamer on 07/30/2021 Please schedule this patient for a In person postpartum visit in 4 weeks with the following provider: MD. Additional Postpartum F/U:Incision and BP check this Tuesday High risk pregnancy complicated by: HTN Delivery mode:  C-Section, Low Transverse  Anticipated Birth Control:   undecided   Arlina Robes, MD

## 2021-07-30 NOTE — Progress Notes (Signed)
Labor Progress Note Lauren Morales is a 25 y.o. G1P1001 at [redacted]w[redacted]d presented for IOL for cHTN/ PEC w SF.  S:  Called to room by RN to assess fetal status. Pt in bed, post-epidural placement, nauseated with vomiting, amenable to Zofran.  O:  BP 115/61 (BP Location: Left Arm)   Pulse 71   Temp 98 F (36.7 C) (Oral)   Resp 18   Ht 5\' 4"  (1.626 m)   Wt (!) 333 lb (151 kg)   LMP 11/04/2020 (Approximate)   SpO2 95%   Breastfeeding Unknown   BMI 57.16 kg/m  EFM: baseline 155 bpm/ no variability/ no accels/ repetitive late decels  Toco/IUPC: q2-36min SVE: Dilation: 4 Effacement (%): 50 Cervical Position: Posterior Station: -1 Presentation: Vertex Exam by:: [email or web address], RNC Pitocin: None due to fetal intolerance  A/P: 25 y.o. G1P1001 [redacted]w[redacted]d  1. Labor: Latent 2. FWB: Cat 2-3 3. Pain: Epidural 4. Pt has been repositioned several times, LR bolus given, oxygen on pt, terb given and baby still have mild repetitive lates with little to no variability. MD notified [redacted]w[redacted]d) and she came to bedside to consent patient for CS.  Para March, CNM, MSN, IBCLC Certified Nurse Midwife, [facility]

## 2021-07-30 NOTE — Progress Notes (Signed)
Labor Progress Note Lauren Morales is a 25 y.o. G1P1001 at [redacted]w[redacted]d presented for IOL for cHTN that progressed to PEC w SF, on magnesium titration  S:  Pt resting in bed, SROM shortly before when she got up to the bathroom. Feeling contractions more sharply and considering epidural.  O:  BP 115/61 (BP Location: Left Arm)   Pulse 71   Temp 98 F (36.7 C) (Oral)   Resp 18   Ht 5\' 4"  (1.626 m)   Wt (!) 333 lb (151 kg)   LMP 11/04/2020 (Approximate)   SpO2 95%   Breastfeeding Unknown   BMI 57.16 kg/m  EFM: baseline 155 bpm/ minimal to no variability/ none accels/ occasional mild late decels  Toco/IUPC: difficult to trace so placing IUPC SVE: Dilation: 4 Effacement (%): 50 Cervical Position: Posterior Station: -1 Presentation: Vertex Exam by:: [email or web address], RNC Pitocin: None, contracting off cytotec and Cat 2 strip  A/P: 54 y.o. G1P1001 [redacted]w[redacted]d  1. Labor: Latent to active, cannot augment at this time due to fetal status 2. FWB: Cat 2, MDs aware. Will continue switching positions, consider starting epidural soon, scalp stim during IUPC placement gave a small 10x10 accel but did not improve variability.  3. Pain: Planning epidural 4. Mag titration: BP stable, no signs of hypermagnesemia   Possible SVD but suspect likely CS due to fetal intolerance of labor.  [redacted]w[redacted]d, CNM 4:46 PM

## 2021-07-30 NOTE — Plan of Care (Signed)
  Problem: Education: Goal: Knowledge of Childbirth will improve Outcome: Not Applicable Goal: Ability to make informed decisions regarding treatment and plan of care will improve Outcome: Not Applicable Goal: Ability to state and carry out methods to decrease the pain will improve Outcome: Not Applicable Goal: Individualized Educational Video(s) Outcome: Not Applicable   Problem: Coping: Goal: Ability to verbalize concerns and feelings about labor and delivery will improve Outcome: Not Applicable   Problem: Life Cycle: Goal: Ability to make normal progression through stages of labor will improve Outcome: Not Applicable Goal: Ability to effectively push during vaginal delivery will improve Outcome: Not Applicable   Problem: Role Relationship: Goal: Will demonstrate positive interactions with the child Outcome: Not Applicable   Problem: Safety: Goal: Risk of complications during labor and delivery will decrease Outcome: Not Applicable   Problem: Pain Management: Goal: Relief or control of pain from uterine contractions will improve Outcome: Not Applicable   

## 2021-07-30 NOTE — Progress Notes (Signed)
Lauren Morales is a 25 y.o. G1P0 at [redacted]w[redacted]d who presented for IOL due to [facility] and BPP 6/8, now with SIPE with severe features.  Subjective: Resting comfortably in bed. Not feeling much pain or contractions yet. No concerns at this time.  Objective: BP 115/77 (BP Location: Left Arm)   Pulse 84   Temp 97.6 F (36.4 C) (Oral)   Resp 18   Ht 5\' 4"  (1.626 m)   Wt (!) 151 kg   LMP 11/04/2020 (Approximate)   SpO2 98%   BMI 57.16 kg/m  I/O last 3 completed shifts: In: 1754.8 [P.O.:120; I.V.:1384.8; IV Piggyback:250] Out: 200 [Urine:200]  FHT:  Baseline 145 bpm, minimal variability, - accels, - decels UC:   Uterine irritability SVE:   Dilation: 2 Effacement (%): 20 Station: -3 Exam by:: Dr. [email or web address], MD  Labs: Lab Results  Component Value Date   WBC 9.0 07/29/2021   HGB 11.3 (L) 07/29/2021   HCT 35.6 (L) 07/29/2021   MCV 75.9 (L) 07/29/2021   PLT 387 07/29/2021    Assessment / Plan: 25 y.o. G1P0 at [redacted]w[redacted]d who presented for IOL due to [employer] and BPP 6/8, now with SIPE with severe features.  Labor: Continues to progress with Cytotec x2. FB placed at 0735 without complication. Will reassess in 4 hours. CHTN with SI Preeclampsia w/ SF:  Normal to mild range pressures. Asymptomatic at this time. On Mag. Pre-E labs normal on admission. Home Procardia 90 mg ordered. Will continue to monitor. Fetal Wellbeing:  Cat II tracing due to minimal variability. On Mg. Will continue to monitor closely. Pain Control: PRN I/D: GBS pos > PCN Anticipated MOD:  SVD  8/8 [facility] Fellow  Faculty Practice 07/30/2021, 7:39 AM

## 2021-07-31 ENCOUNTER — Other Ambulatory Visit (HOSPITAL_COMMUNITY): Payer: Self-pay

## 2021-07-31 MED ORDER — FUROSEMIDE 20 MG PO TABS
20.0000 mg | ORAL_TABLET | Freq: Two times a day (BID) | ORAL | 0 refills | Status: DC
  Filled 2021-07-31: qty 6, 3d supply, fill #0

## 2021-07-31 MED ORDER — OXYCODONE-ACETAMINOPHEN 5-325 MG PO TABS
1.0000 | ORAL_TABLET | Freq: Four times a day (QID) | ORAL | 0 refills | Status: DC | PRN
  Filled 2021-07-31: qty 20, 3d supply, fill #0

## 2021-07-31 MED ORDER — ACETAMINOPHEN 500 MG PO TABS
1000.0000 mg | ORAL_TABLET | Freq: Four times a day (QID) | ORAL | Status: AC
  Administered 2021-07-31 – 2021-08-01 (×4): 1000 mg via ORAL
  Filled 2021-07-31 (×4): qty 2

## 2021-07-31 MED ORDER — IBUPROFEN 800 MG PO TABS
800.0000 mg | ORAL_TABLET | Freq: Three times a day (TID) | ORAL | 3 refills | Status: DC | PRN
Start: 2021-07-31 — End: 2022-01-27
  Filled 2021-07-31: qty 30, 10d supply, fill #0

## 2021-07-31 MED ORDER — NIFEDIPINE ER 90 MG PO TB24
90.0000 mg | ORAL_TABLET | Freq: Every day | ORAL | 1 refills | Status: DC
  Filled 2021-07-31: qty 30, 30d supply, fill #0

## 2021-07-31 MED ORDER — FUROSEMIDE 10 MG/ML IJ SOLN
20.0000 mg | Freq: Two times a day (BID) | INTRAMUSCULAR | Status: DC
  Administered 2021-07-31 (×2): 20 mg via INTRAVENOUS
  Filled 2021-07-31 (×3): qty 2

## 2021-07-31 MED ORDER — ACETAMINOPHEN 500 MG PO TABS
1000.0000 mg | ORAL_TABLET | Freq: Four times a day (QID) | ORAL | 0 refills | Status: DC
Start: 2021-07-31 — End: 2022-01-27
  Filled 2021-07-31: qty 30, 4d supply, fill #0

## 2021-07-31 MED ORDER — OXYCODONE HCL 5 MG PO TABS
5.0000 mg | ORAL_TABLET | ORAL | Status: DC | PRN
  Administered 2021-07-31 – 2021-08-02 (×5): 5 mg via ORAL
  Filled 2021-07-31 (×3): qty 1
  Filled 2021-07-31: qty 2
  Filled 2021-07-31 (×2): qty 1

## 2021-07-31 NOTE — Lactation Note (Addendum)
This note was copied from a baby's chart. Lactation Consultation Note  Patient Name: Lauren Morales [id number] Date: 07/31/2021 Reason for consult: Follow-up assessment;Mother's request;Difficult latch;Early term 37-38.6wks (PIH) Age:25 hours  LC not able to see a latch, infant fed 1 hr prior with formula. Mom stated having trouble with latching infant to breast. Mom to call for latch assistance with next feeding.   Mom denied any pain with current flange size. Mom encouraged to pump with DEBP q 3hrs for 15 min to maintain her milk supply.   LC assisted Mom to start pumping during the visit.   Plan 1. To feed based on cues 8-12x in 24 hr period. Mom to offer infant breast first.  2. Mom to supplement with eBM first followed by formula with pace bottle feeding. BF supplementation volume reviewed. Mom aware to offer more if infant not able to latch.  3. Mom to pump with dEBP q 3 hrs for .  All questions answered at the end of the visit.   Maternal Data Has patient been taught Hand Expression?: Yes  Feeding Mother's Current Feeding Choice: Breast Milk and Formula Nipple Type: Slow - flow  LATCH Score                    Lactation Tools Discussed/Used Tools: Pump;Flanges Flange Size: 24 Breast pump type: Double-Electric Breast Pump Pump Education: Setup, frequency, and cleaning;Milk Storage Reason for Pumping: increase stimulation Pumping frequency: every 3 hrs for 15 min  Interventions Interventions: Breast feeding basics reviewed;Breast compression;Skin to skin;Breast massage;Hand express;Expressed milk;Education;DEBP;Pace feeding  Discharge    Consult Status Consult Status: Follow-up Date: 08/01/21 Follow-up type: In-patient    Annayah Worthley  Nicholson-Springer 07/31/2021, 2:31 PM

## 2021-07-31 NOTE — Progress Notes (Signed)
Subjective: Postpartum Day 1: Cesarean Delivery Patient reports incisional pain.    Objective: Vital signs in last 24 hours: Temp:  [97.7 F (36.5 C)-98.1 F (36.7 C)] 97.8 F (36.6 C) (08/19 0746) Pulse Rate:  [63-86] 86 (08/19 0746) Resp:  [17-24] 19 (08/19 0746) BP: (102-135)/(59-91) 108/71 (08/19 0746) SpO2:  [90 %-100 %] 98 % (08/19 0746)  Intake/Output Summary (Last 24 hours) at 07/31/2021 0832 Last data filed at 07/31/2021 0800 Gross per 24 hour  Intake 5106.17 ml  Output 3222 ml  Net 1884.17 ml    Physical Exam:  General: alert, cooperative, and appears stated age 25: appropriate Uterine Fundus: firm Incision: no significant drainage DVT Evaluation: No evidence of DVT seen on physical exam.  Recent Labs    07/29/21 1000 07/29/21 2324  HGB 12.0 11.3*  HCT 37.9 35.6*    Assessment/Plan: Status post Cesarean section. Doing well postoperatively.  Continue current care. On Procardia, BP ok Add Lasix to help mobilize fluid Magnesium x 24 hours. Meds to beds and BabyRx ordered  Reva Bores 07/31/2021, 8:32 AM

## 2021-08-01 ENCOUNTER — Inpatient Hospital Stay (HOSPITAL_COMMUNITY): Payer: Medicaid Other

## 2021-08-01 MED ORDER — ENOXAPARIN SODIUM 80 MG/0.8ML IJ SOSY
0.5000 mg/kg | PREFILLED_SYRINGE | INTRAMUSCULAR | Status: DC
  Administered 2021-08-01 – 2021-08-02 (×2): 75 mg via SUBCUTANEOUS
  Filled 2021-08-01 (×2): qty 0.8

## 2021-08-01 MED ORDER — POLYETHYLENE GLYCOL 3350 17 G PO PACK
17.0000 g | PACK | Freq: Every day | ORAL | Status: DC
  Administered 2021-08-02: 17 g via ORAL
  Filled 2021-08-01: qty 1

## 2021-08-01 MED ORDER — PRENATAL MULTIVITAMIN CH
1.0000 | ORAL_TABLET | Freq: Every day | ORAL | Status: DC
  Administered 2021-08-01 – 2021-08-02 (×2): 1 via ORAL
  Filled 2021-08-01 (×2): qty 1

## 2021-08-01 MED ORDER — FUROSEMIDE 40 MG PO TABS
20.0000 mg | ORAL_TABLET | Freq: Two times a day (BID) | ORAL | Status: DC
  Administered 2021-08-01 – 2021-08-02 (×3): 20 mg via ORAL
  Filled 2021-08-01 (×3): qty 1

## 2021-08-01 NOTE — Lactation Note (Signed)
This note was copied from a baby's chart. Lactation Consultation Note  Patient Name: Lauren Morales [id number] Date: 08/01/2021 Reason for consult: Follow-up assessment;Mother's request;Early term 37-38.6wks Age:25 hours  LC went in to assist with pumping and latching. Infant resting comfortably. Mom to call for latch assistance for next feeding.   LC encouraged Mom to pump consistently with DEBP q 3 hrs for to maintain her milk supply.  Mom using 24 flange and denies any pain with pumping.  LC reviewed milk storage.  All questions answered at the end of the visit.   Maternal Data    Feeding Mother's Current Feeding Choice: Breast Milk and Formula Nipple Type: Slow - flow  LATCH Score                    Lactation Tools Discussed/Used Flange Size: 24 Breast pump type: Double-Electric Breast Pump Pump Education: Setup, frequency, and cleaning;Milk Storage Reason for Pumping: increase stimulation Pumping frequency: every 3 hrs for  Interventions Interventions: Breast feeding basics reviewed;DEBP;Education  Discharge    Consult Status Consult Status: Follow-up Date: 08/02/21 Follow-up type: In-patient    Lauren Bartnik  Morales 08/01/2021, 7:02 PM

## 2021-08-01 NOTE — Progress Notes (Signed)
POSTPARTUM PROGRESS NOTE  POD #2  Subjective:  Lauren Morales is a 25 y.o. G1P1001 s/p pLTCS at [redacted]w[redacted]d. Today she notes no acute complaints. She denies any problems with ambulating, voiding or po intake. Denies nausea or vomiting. She has passed flatus, noBM.  Pain is well controlled.  Lochia minimal Denies fever/chills/chest pain/SOB.  no HA, no blurry vision, no RUQ pain  Objective: Blood pressure 116/78, pulse 82, temperature 98.2 F (36.8 C), temperature source Oral, resp. rate 18, height 5\' 4"  (1.626 m), weight (!) 150.3 kg, last menstrual period 11/04/2020, SpO2 97 %, unknown if currently breastfeeding.  Physical Exam:  General: alert, cooperative and no distress Chest: no respiratory distress Heart: regular rate and rhythm Abdomen: obese, soft, nontender, +BS Uterine Fundus: firm, appropriately tender Incision: Prevena in place DVT Evaluation: No calf swelling or tenderness Extremities: no edema Skin: warm, dry  No results found for this or any previous visit (from the past 24 hour(s)).  Assessment/Plan: Lauren Morales is a 25 y.o. G1P1001 s/p pLTCS at [redacted]w[redacted]d POD#2 complicated by: 1) cHTN with superimposed preeclampsia S/p Mag BP well controlled with Procardia 90 daily Continue Lasix twice daily  2) Postop -pain well controlled -Lovenox for DVT prophylaxis  Contraception: unsure Feeding: bottle  Dispo: Continue postop care as outlined above   LOS: 3 days   [redacted]w[redacted]d, DO Faculty Attending, [facility] 08/01/2021, 7:21 AM

## 2021-08-01 NOTE — Lactation Note (Signed)
This note was copied from a baby's chart. Lactation Consultation Note  Patient Name: Lauren Morales [id number] Date: 08/01/2021 Reason for consult: Follow-up assessment;Mother's request;Early term 37-38.6wks Age:25 hours LC alerted lactation, Rachel Moulds, mom needs latch assistance. LC also alerted RN on the floor to help if Robyn not available.  Maternal Data    Feeding Mother's Current Feeding Choice: Breast Milk and Formula Nipple Type: Slow - flow  LATCH Score                    Lactation Tools Discussed/Used Flange Size: 24 Breast pump type: Double-Electric Breast Pump Pump Education: Setup, frequency, and cleaning;Milk Storage Reason for Pumping: increase stimulation Pumping frequency: every 3 hrs for  Interventions Interventions: Breast feeding basics reviewed;DEBP;Education  Discharge    Consult Status Consult Status: Follow-up Date: 08/02/21 Follow-up type: In-patient    Tifani Dack  Nicholson-Springer 08/01/2021, 7:34 PM

## 2021-08-02 ENCOUNTER — Ambulatory Visit: Payer: Self-pay

## 2021-08-02 NOTE — Lactation Note (Signed)
This note was copied from a baby's chart. Lactation Consultation Note  Patient Name: Lauren Morales POEUM'P Date: 08/02/2021 Reason for consult: Mother's request;Follow-up assessment;1st time breastfeeding;Early term 37-38.6wks Age:25 hours, -3% weight loss. Per mom, she wants to breastfeed infant, she has mostly been formula feeding infant due to infant not latching well at the breast. LC unable assess latch due infant receiving 32 mls of Similac with iron formula, 1 hour prior to [facility] entering the room. Per mom, infant is not sustaining latch, does a lot  tongue thrusting and comes off the breast. Per mom, when using the DEBP  she is now expressing 30 mls. Mom has not been breastfeeding infant skin to skin. LC observed mom has flat nipples, LC gave mom hand pump to pre-pump breast prior to latching infant. Mom will ask RN to assist with latching infant at the next feeding.  LC services will follow up with mom in morning.  Mom's feeding plan: 1- Breastfeeding infant according to cues, 8 to 12+ times within 24 hours, skin to skin. 2- Ask for latch assistance from RN. 3- Pre-pump breast prior to latching infant with hand pump. 4- Supplement infant with mom's EBM first before supplementing with formula.   5- Continue to use DEBP every 3 hours for 15 minutes to maintain mom's milk supply.  Maternal Data    Feeding Mother's Current Feeding Choice: Breast Milk and Formula Nipple Type: Slow - flow  LATCH Score                    Lactation Tools Discussed/Used    Interventions    Discharge    Consult Status Consult Status: Follow-up Date: 08/02/21 Follow-up type: In-patient    Danelle Earthly 08/02/2021, 12:15 AM

## 2021-08-02 NOTE — Lactation Note (Signed)
This note was copied from a baby's chart. Lactation Consultation Note  Patient Name: Lauren Morales [id number] Date: 08/02/2021 Reason for consult: Follow-up assessment;Mother's request;Difficult latch;Early term 37-38.6wks Age:25 hours  LC worked with mother on latching infant in football with pendulous breasts supported on a pillow. Infant not showing signs of tongue sucking following suck training.  Infant recent feeding did not initiate a suck.    Plan 1. To feed based on cues 8-12x in 24 hr period. Mom to offer breast first followed by supplementation.  2. Mom to supplement with pace bottle feeding her eBM, with pumping volume 30 ml or more enough to feed infant with. 3. Mom declined WIC pump loaner.  Mom plans to get a electric pump on the way home. DEBP q 3hrs for 15 min  4. Mom provided with outpatient LC number to make outpatient appointment for further latch assistance.   All questions answered at the end of the visit.   Maternal Data    Feeding Mother's Current Feeding Choice: Breast Milk and Formula Nipple Type: Slow - flow  LATCH Score   Lactation Tools Discussed/Used Tools: Pump;Flanges Flange Size: 24 Breast pump type: Double-Electric Breast Pump Pump Education: Setup, frequency, and cleaning;Milk Storage Reason for Pumping: increase stimulation Pumping frequency: every 3 hrs for 15 min  Interventions Interventions: Breast feeding basics reviewed;Breast compression;Assisted with latch;Adjust position;Skin to skin;Support pillows;DEBP;Breast massage;Position options;Hand express;Expressed milk;Education;Pace feeding;Shells  Discharge Discharge Education: Engorgement and breast care;Warning signs for feeding baby;Outpatient recommendation  Consult Status Consult Status: Complete Date: 08/02/21    Ronson Hagins  Nicholson-Springer 08/02/2021, 2:39 PM

## 2021-08-03 ENCOUNTER — Other Ambulatory Visit: Payer: Self-pay | Admitting: Obstetrics and Gynecology

## 2021-08-03 DIAGNOSIS — Z6841 Body Mass Index (BMI) 40.0 and over, adult: Secondary | ICD-10-CM

## 2021-08-03 LAB — SURGICAL PATHOLOGY

## 2021-08-04 ENCOUNTER — Other Ambulatory Visit: Payer: Self-pay

## 2021-08-04 ENCOUNTER — Ambulatory Visit (INDEPENDENT_AMBULATORY_CARE_PROVIDER_SITE_OTHER): Payer: Medicaid Other

## 2021-08-04 VITALS — BP 124/98 | HR 100

## 2021-08-04 DIAGNOSIS — Z0130 Encounter for examination of blood pressure without abnormal findings: Secondary | ICD-10-CM

## 2021-08-04 DIAGNOSIS — Z5189 Encounter for other specified aftercare: Secondary | ICD-10-CM

## 2021-08-04 NOTE — Progress Notes (Signed)
Here for incision and blood pressure check following primary c-section on 07/30/21. Provena in place, dressing is sealed and device running. History of chronic hypertension. Blood pressure today is 124/98. Bilateral pitting edema to lower legs, pt unable to tolerate full assessment of edema. Patient states she does not think swelling in legs has improved since discharge. Denies any headache, vision changes, or dizziness. Reports taking nifedipine 90 mg daily. Has not taken Lasix that was prescribed at discharge.   Reviewed with Crissie Reese, MD who states pt should return 7-10 days from date of c-section for Provena removal. Also recommends patient take prescribed 3 day course of Lasix 20 mg BID as this may improve swelling and elevated blood pressure. Pt will continue nifedipine 90 mg daily. Pt will return Friday AM for repeat BP check and Provena removal. Pt encouraged to contact the office with any symptoms of elevated BP prior to appt.   Fleet Contras RN 08/04/21

## 2021-08-04 NOTE — Progress Notes (Signed)
Chart reviewed for nurse visit. Agree with plan of care.   Venora Maples, MD 08/04/21 5:16 PM

## 2021-08-05 ENCOUNTER — Encounter: Payer: Medicaid Other | Admitting: Obstetrics and Gynecology

## 2021-08-05 ENCOUNTER — Ambulatory Visit: Payer: Medicaid Other

## 2021-08-05 ENCOUNTER — Other Ambulatory Visit: Payer: Medicaid Other

## 2021-08-07 ENCOUNTER — Ambulatory Visit (INDEPENDENT_AMBULATORY_CARE_PROVIDER_SITE_OTHER): Payer: Medicaid Other

## 2021-08-07 ENCOUNTER — Other Ambulatory Visit: Payer: Self-pay

## 2021-08-07 VITALS — BP 130/75 | HR 98 | Ht 64.0 in | Wt 331.0 lb

## 2021-08-07 DIAGNOSIS — Z98891 History of uterine scar from previous surgery: Secondary | ICD-10-CM

## 2021-08-07 NOTE — Progress Notes (Signed)
Patient was assessed and managed by nursing staff during this encounter. I have reviewed the chart and agree with the documentation and plan. I have also made any necessary editorial changes.  Nikeia Henkes A Estefania Kamiya, MD 08/07/2021 1:40 PM   

## 2021-08-07 NOTE — Progress Notes (Signed)
Pt here today for wound check. Pt had C-section on 07/30/2021.  Pt denies any drainage, redness or fever to site.   Incision today is clean, dry, intact and well approximated. Pt had Prvovena dressing and wound vac that were removed today. Pt tolerated well.   Pt advised to keep clean and dry. Pt verbalized understanding.   Pt has scheduled PP visit on 08/26/2021.   Judeth Cornfield, RN

## 2021-08-10 ENCOUNTER — Telehealth (HOSPITAL_COMMUNITY): Payer: Self-pay | Admitting: *Deleted

## 2021-08-10 NOTE — Telephone Encounter (Signed)
Left message to return nurse call.  Duffy Rhody, RN 08-10-2021 at 4:24pm

## 2021-08-25 NOTE — Progress Notes (Deleted)
    Post Partum Visit Note  Lauren Morales is a 25 y.o. G54P1001 female who presents for a postpartum visit. She is 5 weeks postpartum following a primary cesarean section.  I have fully reviewed the prenatal and intrapartum course. The delivery was at 37 gestational weeks.  Anesthesia: epidural. Postpartum course has been uncomplicated. Baby is doing well***. Baby is feeding by {breast/bottle:69}. Bleeding {vag bleed:12292}. Bowel function is normal. Bladder function is normal. Patient {is/is not:9024} sexually active. Contraception method is {contraceptive method:5051}. Postpartum depression screening: {gen negative/positive:315881}.   The pregnancy intention screening data noted above was reviewed. Potential methods of contraception were discussed. The patient elected to proceed with No data recorded.    Health Maintenance Due  Topic Date Due   HPV VACCINES (1 - 2-dose series) Never done   TETANUS/TDAP  Never done    {Common ambulatory SmartLinks:19316}  Review of Systems {ros; complete:30496}  Objective:  LMP  (LMP Unknown)    General:  {gen appearance:16600}   Breasts:  {desc; normal/abnormal/not indicated:14647}  Lungs: {lung exam:16931}  Heart:  {heart exam:5510}  Abdomen: {abdomen exam:16834}   Wound {Wound assessment:11097}  GU exam:  {desc; normal/abnormal/not indicated:14647}       Assessment:    There are no diagnoses linked to this encounter.  *** postpartum exam.   Plan:   Essential components of care per ACOG recommendations:  1.  Mood and well being: Patient with {gen negative/positive:315881} depression screening today. Reviewed local resources for support.  - Patient tobacco use? No.   - hx of drug use? No.    2. Infant care and feeding:  -Patient currently breastmilk feeding? {yes/no:25502}  -Social determinants of health (SDOH) reviewed in EPIC. No concerns***The following needs were identified***  3. Sexuality, contraception and birth spacing -  Patient {DOES_DOES CMK:34917} want a pregnancy in the next year.  Desired family size is {NUMBER 1-10:22536} children.  - Reviewed forms of contraception in tiered fashion. Patient desired {PLAN CONTRACEPTION:313102} today.   - Discussed birth spacing of 18 months  4. Sleep and fatigue -Encouraged family/partner/community support of 4 hrs of uninterrupted sleep to help with mood and fatigue  5. Physical Recovery  - Discussed patients delivery and complications. She describes her labor as {description:25511} - Patient had a  c-section for NRFHT and remote from delivery . Perineal healing reviewed. Patient expressed understanding - Patient has urinary incontinence? {yes/no:25515} - Patient {ACTION; IS/IS HXT:05697948} safe to resume physical and sexual activity  6.  Health Maintenance - HM due items addressed {Yes or If no, why not?:20788} - Last pap smear  Diagnosis  Date Value Ref Range Status  01/19/2021   Final   - Negative for intraepithelial lesion or malignancy (NILM)   Pap smear not done at today's visit.  -Breast Cancer screening indicated? No.   7. Chronic Disease/Pregnancy Condition follow up: Anemia and Hypertension  - PCP follow up  Milas Hock, MD [facility], [facility]

## 2021-08-26 ENCOUNTER — Ambulatory Visit: Payer: Medicaid Other | Admitting: Obstetrics and Gynecology

## 2021-09-01 ENCOUNTER — Telehealth: Payer: Self-pay | Admitting: *Deleted

## 2021-09-01 NOTE — Telephone Encounter (Signed)
Patient calling from disability company [employer] expedited completion of additional ofrms sent on 08-27-21.  Completed forms were faxed on 9-2 and new forms received on 9-15 with indications for c-section. Advised since release is available, will complete these three questions but will have to resubmit if additional info needed.  Forms faxed as requested- see scanned copy.

## 2021-09-11 ENCOUNTER — Encounter: Payer: Self-pay | Admitting: Radiology

## 2021-09-15 ENCOUNTER — Ambulatory Visit: Payer: Medicaid Other | Admitting: Advanced Practice Midwife

## 2021-12-17 ENCOUNTER — Encounter: Payer: Self-pay | Admitting: Obstetrics and Gynecology

## 2021-12-17 ENCOUNTER — Other Ambulatory Visit: Payer: Self-pay

## 2021-12-17 ENCOUNTER — Ambulatory Visit (INDEPENDENT_AMBULATORY_CARE_PROVIDER_SITE_OTHER): Payer: Medicaid Other | Admitting: Obstetrics and Gynecology

## 2021-12-17 VITALS — BP 100/76 | HR 88 | Temp 97.9°F | Ht 64.0 in | Wt 318.4 lb

## 2021-12-17 DIAGNOSIS — Z30430 Encounter for insertion of intrauterine contraceptive device: Secondary | ICD-10-CM

## 2021-12-17 DIAGNOSIS — Z3009 Encounter for other general counseling and advice on contraception: Secondary | ICD-10-CM | POA: Diagnosis not present

## 2021-12-17 DIAGNOSIS — Z3202 Encounter for pregnancy test, result negative: Secondary | ICD-10-CM

## 2021-12-17 DIAGNOSIS — Z30018 Encounter for initial prescription of other contraceptives: Secondary | ICD-10-CM

## 2021-12-17 LAB — POCT URINE PREGNANCY: Preg Test, Ur: NEGATIVE

## 2021-12-17 MED ORDER — IBUPROFEN 200 MG PO TABS
800.0000 mg | ORAL_TABLET | Freq: Once | ORAL | Status: AC
  Administered 2021-12-17: 800 mg via ORAL

## 2021-12-17 MED ORDER — LEVONORGESTREL 20.1 MCG/DAY IU IUD
1.0000 | INTRAUTERINE_SYSTEM | Freq: Once | INTRAUTERINE | Status: AC
  Administered 2021-12-17: 1 via INTRAUTERINE

## 2021-12-17 NOTE — Addendum Note (Signed)
Addended by: Clovis Pu on: 12/17/2021 11:08 AM   Modules accepted: Orders

## 2021-12-17 NOTE — Patient Instructions (Addendum)
IUD PLACEMENT POST-PROCEDURE INSTRUCTIONS  You may take Ibuprofen, Aleve or Tylenol for pain if needed.  Cramping should resolve within in 24 hours.  You may have a small amount of spotting.  You should wear a mini pad for the next few days.  You may have intercourse after 24 hours.  If you using this for birth control, it is effective immediately.  You need to call if you have any pelvic pain, fever, heavy bleeding or foul smelling vaginal discharge.  Irregular bleeding is common the first several months after having an IUD placed. You do not need to call for this reason unless you are concerned.  Shower or bathe as normal  You should have a follow-up appointment in 4-8 weeks for a re-check to make sure you are not having any problems.  Levonorgestrel Intrauterine Device What is this medication? LEVONORGESTREL (LEE voe nor jes trel) prevents ovulation and pregnancy. It may also be used to treat heavy periods. It belongs to a group of medications called contraceptives. This medication is a progestin hormone. This medicine may be used for other purposes; ask your health care provider or pharmacist if you have questions. COMMON BRAND NAME(S): Cameron Ali What should I tell my care team before I take this medication? They need to know if you have any of these conditions: Abnormal Pap smear Cancer of the breast, uterus, or cervix Diabetes Endometritis Genital or pelvic infection now or in the past Have more than one sexual partner or your partner has more than one partner Heart disease History of an ectopic or tubal pregnancy Immune system problems IUD in place Liver disease or tumor Problems with blood clots or take blood-thinners Seizures Use intravenous drugs Uterus of unusual shape Vaginal bleeding that has not been explained An unusual or allergic reaction to levonorgestrel, other hormones, silicone, or polyethylene, medications, foods, dyes, or  preservatives Pregnant or trying to get pregnant Breast-feeding How should I use this medication? This device is placed inside the uterus by your care team. A patient package insert for the product will be given each time it is inserted. Be sure to read this information carefully each time. The sheet may change often. Talk to your care team about use of this medication in children. Special care may be needed. Overdosage: If you think you have taken too much of this medicine contact a poison control center or emergency room at once. NOTE: This medicine is only for you. Do not share this medicine with others. What if I miss a dose? This does not apply. Depending on the brand of device you have inserted, the device will need to be replaced every 3 to 8 years if you wish to continue using this type of birth control. What may interact with this medication? Interactions are not expected. Tell your care team about all the medications you take. This list may not describe all possible interactions. Give your health care provider a list of all the medicines, herbs, non-prescription drugs, or dietary supplements you use. Also tell them if you smoke, drink alcohol, or use illegal drugs. Some items may interact with your medicine. What should I watch for while using this medication? Visit your care team for regular check-ups. Tell your care team if you or your partner becomes HIV positive or gets a sexually transmitted disease. Using this medication does not protect you or your partner against HIV or other sexually transmitted infections (STIs). You can check the placement of the IUD yourself by  reaching up to the top of your vagina with clean fingers to feel the threads. Do not pull on the threads. It is a good habit to check placement after each menstrual period. Call your care team right away if you feel more of the IUD than just the threads or if you cannot feel the threads at all. The IUD may come out by  itself. You may become pregnant if the device comes out. If you notice that the IUD has come out use a backup birth control method like condoms and call your care team. Using tampons will not change the position of the IUD and are okay to use during your period. This IUD can be safely scanned with magnetic resonance imaging (MRI) only under specific conditions. Before you have an MRI, tell your care team that you have an IUD in place, and which type of IUD you have in place. What side effects may I notice from receiving this medication? Side effects that you should report to your care team as soon as possible: Allergic reactions--skin rash, itching, hives, swelling of the face, lips, tongue, or throat Blood clot--pain, swelling, or warmth in the leg, shortness of breath, chest pain Gallbladder problems--severe stomach pain, nausea, vomiting, fever Increase in blood pressure Liver injury--right upper belly pain, loss of appetite, nausea, light-colored stool, dark yellow or brown urine, yellowing skin or eyes, unusual weakness or fatigue New or worsening migraines or headaches Pelvic inflammatory disease (PID)--fever, abdominal pain, pelvic pain, pain or trouble passing urine, spotting, bleeding during or after sex Stroke--sudden numbness or weakness of the face, arm, or leg, trouble speaking, confusion, trouble walking, loss of balance or coordination, dizziness, severe headache, change in vision Unusual vaginal discharge, itching, or odor Vaginal pain, irritation, or sores Worsening mood, feelings of depression Side effects that usually do not require medical attention (report to your care team if they continue or are bothersome): Breast pain or tenderness Dark patches of skin on the face or other sun-exposed areas Irregular menstrual cycles or spotting Nausea Weight gain This list may not describe all possible side effects. Call your doctor for medical advice about side effects. You may report  side effects to FDA at 1-800-FDA-1088. Where should I keep my medication? This does not apply. NOTE: This sheet is a summary. It may not cover all possible information. If you have questions about this medicine, talk to your doctor, pharmacist, or health care provider.  2022 Elsevier/Gold Standard (2021-08-12 00:00:00)

## 2021-12-17 NOTE — Progress Notes (Signed)
° ° °  IUD INSERTION PROCEDURE NOTE  Lauren Morales is a 26 y.o. G1P1001 here for IUD insertion. No GYN concerns.   She was counseled regarding the risks/benefits of IUD including insertion risk of infection, hemorrhage, damage to surrounding tissue and organs, uterine perforation. She was counseled regarding risks of IUD including implantation into uterine wall, migration outside of uterus, possible need for hysteroscopic or laparoscopic removal, ovarian cysts, expulsion. She was advised that risk of pregnancy is low with negative UPT but is not zero and IUD insertion may cause miscarriage. Reviewed that she is also at slightly higher risk for ectopic pregnancy and she should take a pregnancy test if she believes she may be pregnant. She was advised to use backup method of protection for one week. She verbalized understanding of all of the above and consent signed.   Last menstrual period was: 11/27/2021 Last intercourse was 12/15/2021 and protected Last pap smear was on 2020 and was normal UPT today: Negative  Encounter for initial prescription of other contraceptives  Encounter for counseling regarding contraception  - POCT urine pregnancy  Urine pregnancy test negative   Encounter for IUD insertion  Patient identified and an adequate time out was performed. Speculum placed in the vagina. The cervix was cleaned with Betadine x 2 and grasped anteriorly with a single tooth tenaculum.  A uterine sound was used to sound the uterus to 7.5 cm;  the IUD was then placed per manufacturer's recommendations. Strings trimmed to 3 cm. Tenaculum was removed, good hemostasis noted after application of pressure and use of silver nitrate sticks x 2. Patient tolerated procedure well.   Patient was given post-procedure instructions.  She was reminded to have backup contraception for one week during this transition period between IUDs.  Patient was also asked to check IUD strings periodically and follow up in 4  weeks for IUD check.  Liletta IUD Exp: 03/2023 Lot: [id number]   Total time counseling on family planning options, performing procedure and postprocedure instructions was 25 minutes. There was 5 minutes of chart review time spent prior to this encounter. Total time spent = 30 minutes.    Raelyn Mora, CNM 12/17/2021 10:24 AM

## 2021-12-17 NOTE — Progress Notes (Signed)
Risks and benefits of IUD discussed including the risks of irregular bleeding, cramping, infection, malpositioning, expulsion or uterine perforation of the IUD (1:1000 placements)  which may require further procedures such as laparoscopy.  IUDs, while effective at preventing pregnancy, do not prevent transmission of sexually transmitted diseases and use of barrier methods for this purpose was discussed.  Low overall incidence of failure with 99.7% efficacy rate in typical use. The patient has no contraindication to IUD placement. Patient verbalized an understanding of the plan of care and agrees.

## 2021-12-20 IMAGING — CR DG CHEST 2V
2 series · 2 of 2 positions shown · non-contrast
Comparison: None.

CLINICAL DATA: Upper chest pain, abdominal cramping, nausea several
nights prior

EXAM:
CHEST - 2 VIEW

[w chest pa]
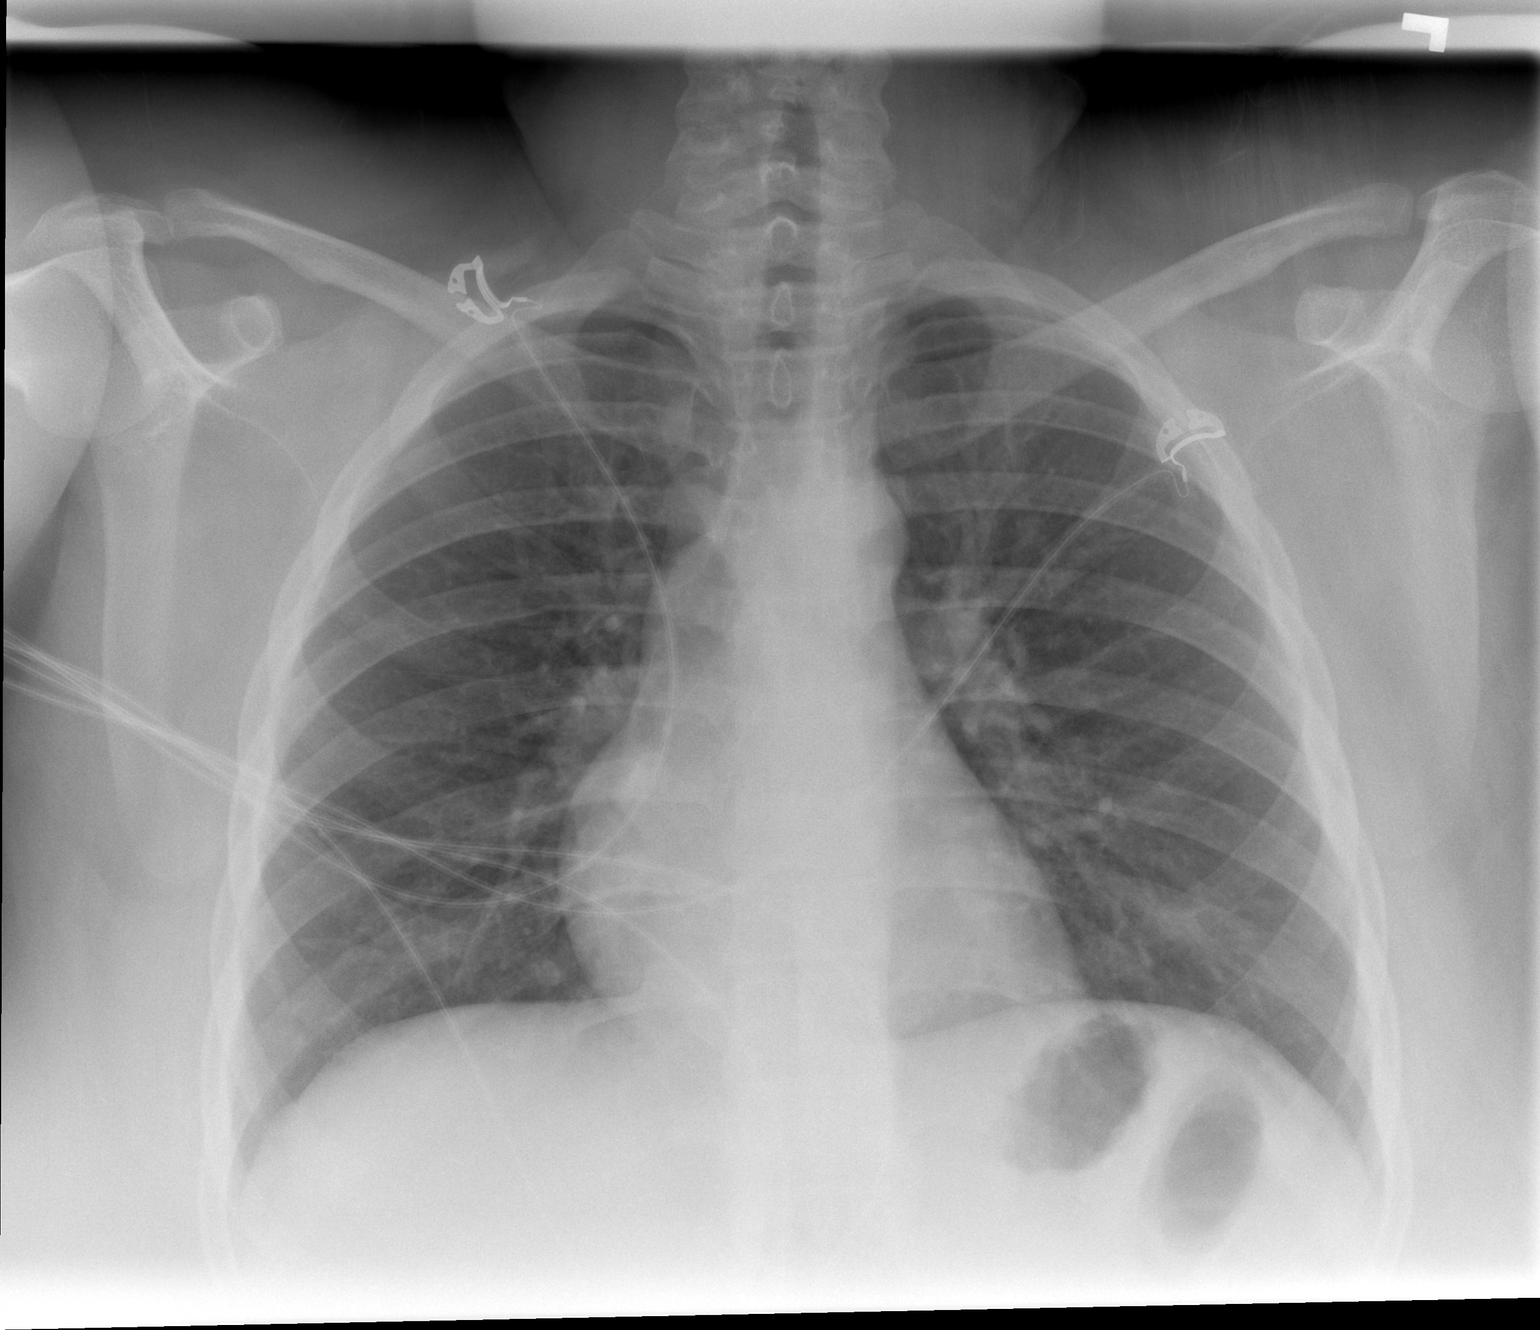

[w chest lat]
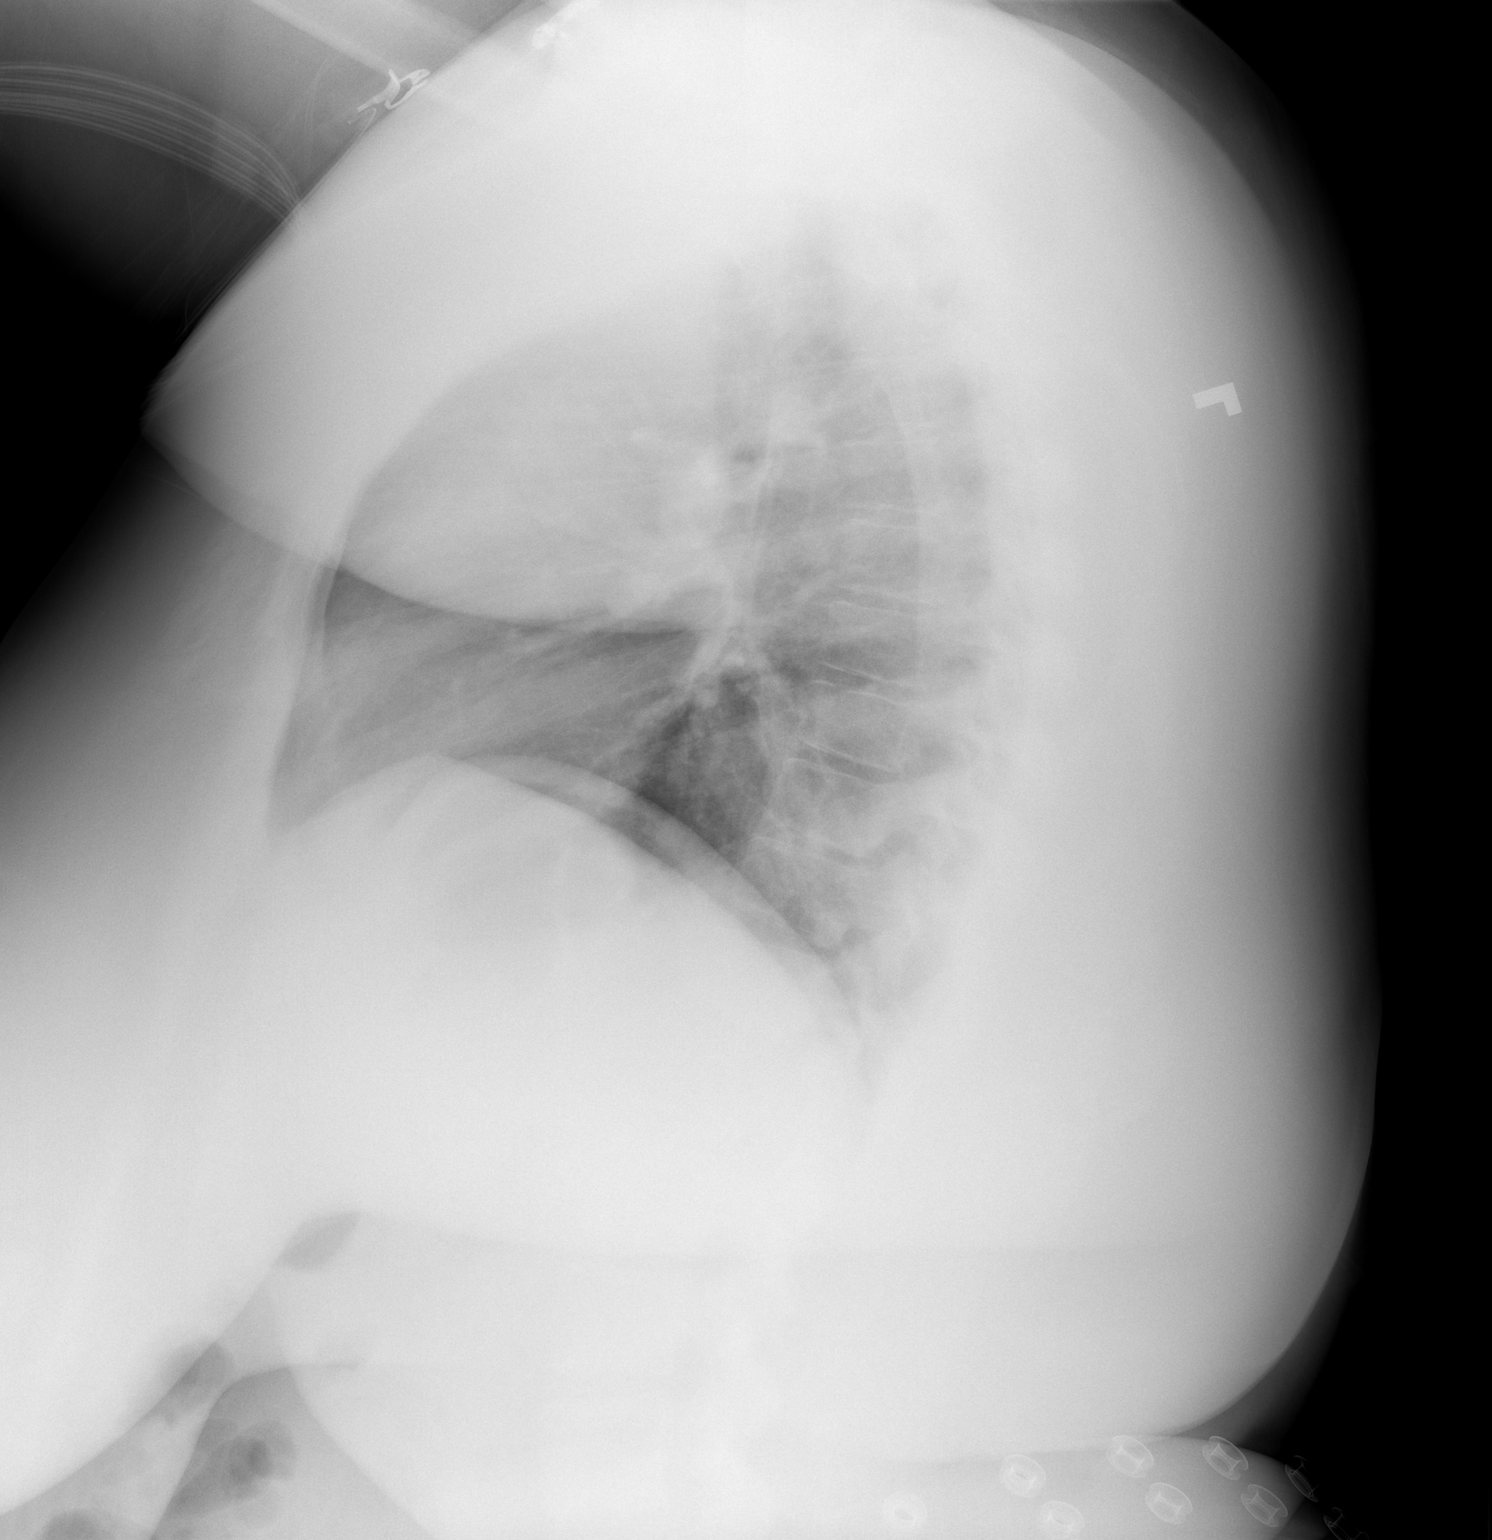

[2 of 2 positions shown; findings below may reference images not displayed]

FINDINGS: Streaky opacities towards the lung bases, could reflect atelectasis
or early infection in the appropriate clinical setting. No
pneumothorax. No effusion. No convincing features of edema. The
cardiomediastinal contours are unremarkable. No acute osseous or
soft tissue abnormality. Telemetry leads overlie the chest.
IMPRESSION: Streaky opacities towards the lung bases, could reflect atelectasis
or early infection in the appropriate clinical setting.

## 2021-12-21 IMAGING — US US ABDOMEN LIMITED
1 series · 14 of 25 positions shown · non-contrast
Comparison: None

CLINICAL DATA: RIGHT upper quadrant abdominal pain, pregnant

EXAM:
ULTRASOUND ABDOMEN LIMITED RIGHT UPPER QUADRANT

[Series 1: us abdomen limited · 14 of 30 slices shown]
[im 1/30]
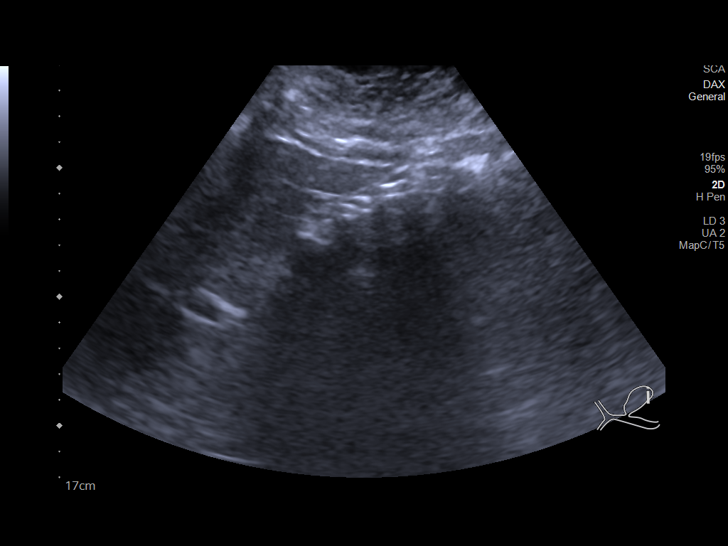
[im 3/30]
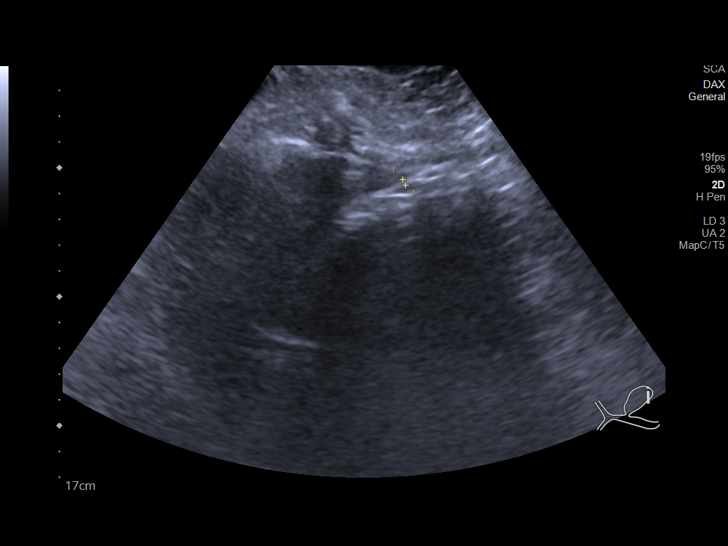
[im 5/30]
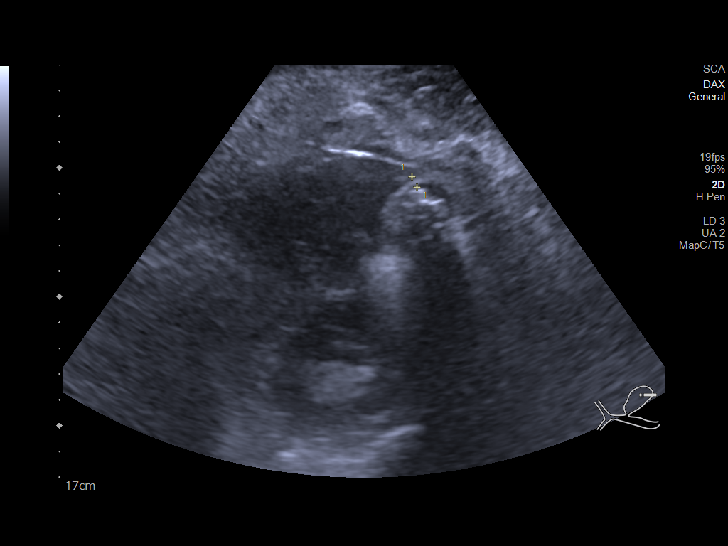
[im 8/30]
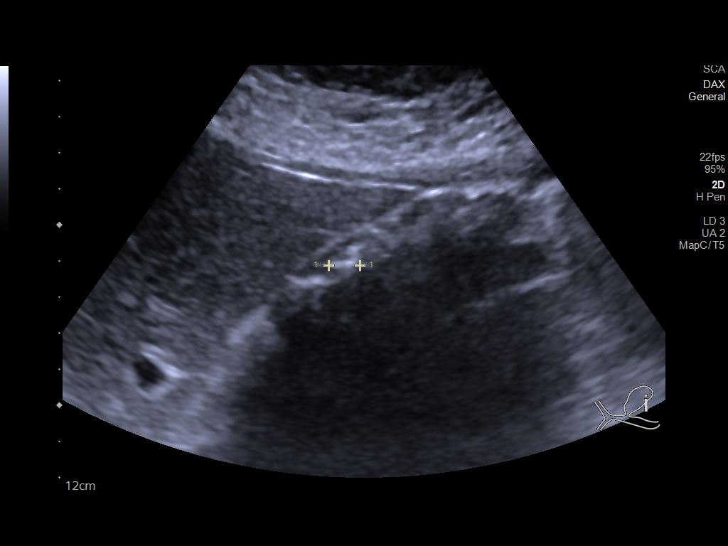
[im 10/30]
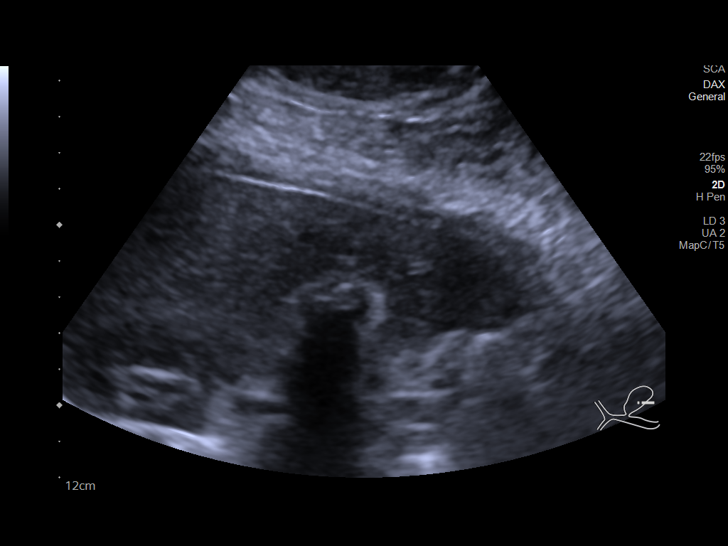
[im 11/30]
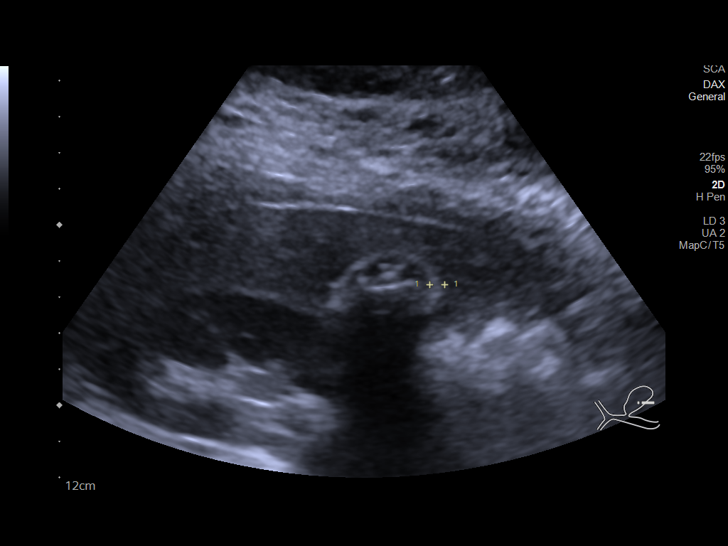
[im 14/30]
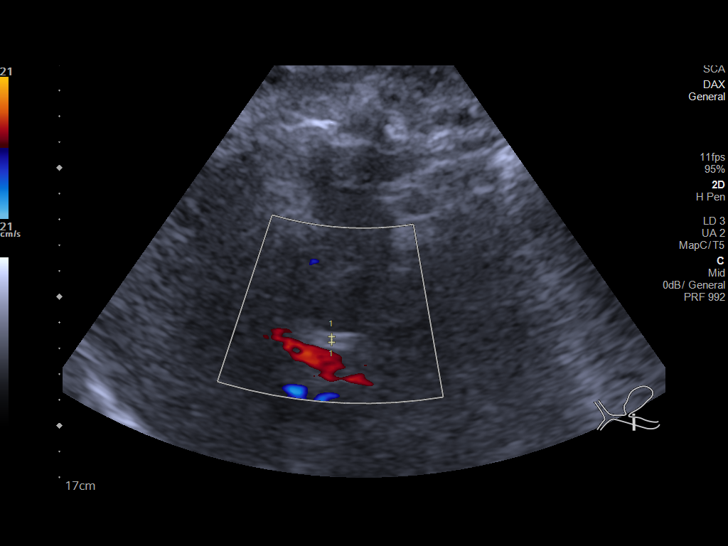
[im 16/30]
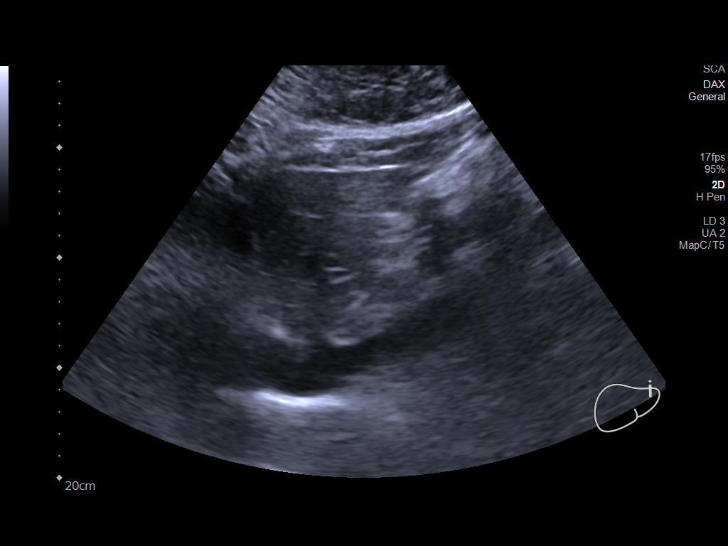
[im 19/30]
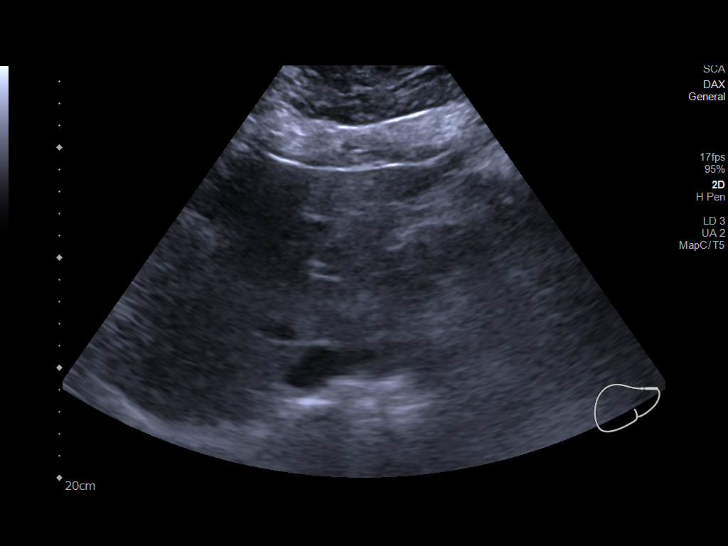
[im 20/30]
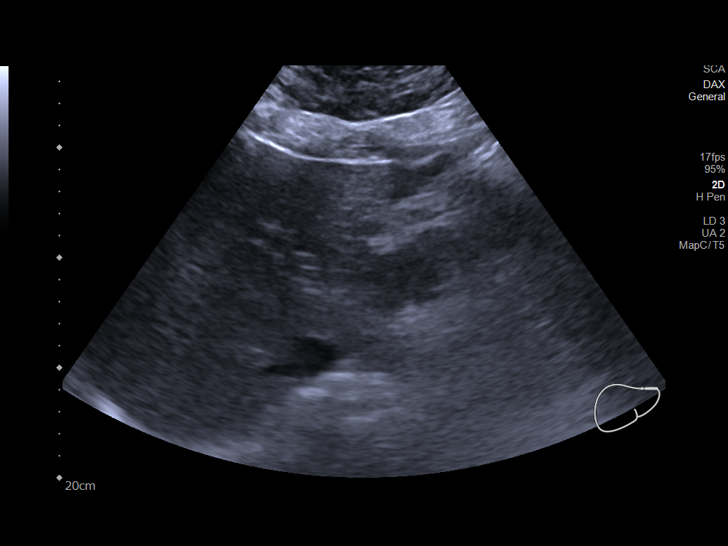
[im 22/30]
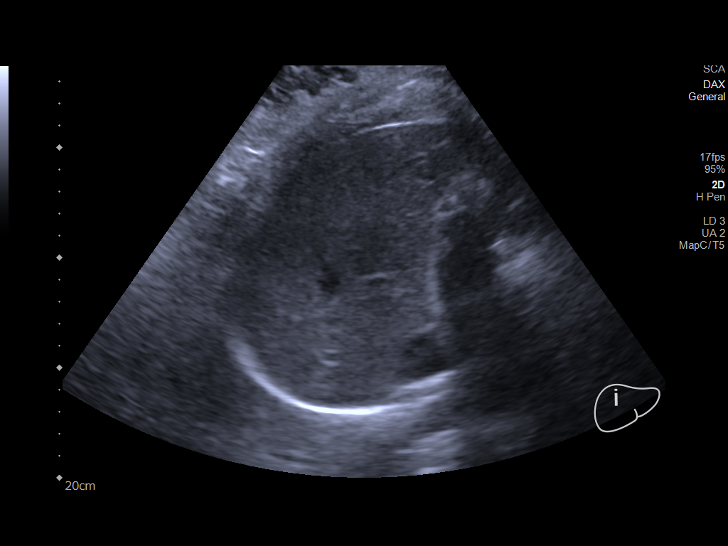
[im 25/30]
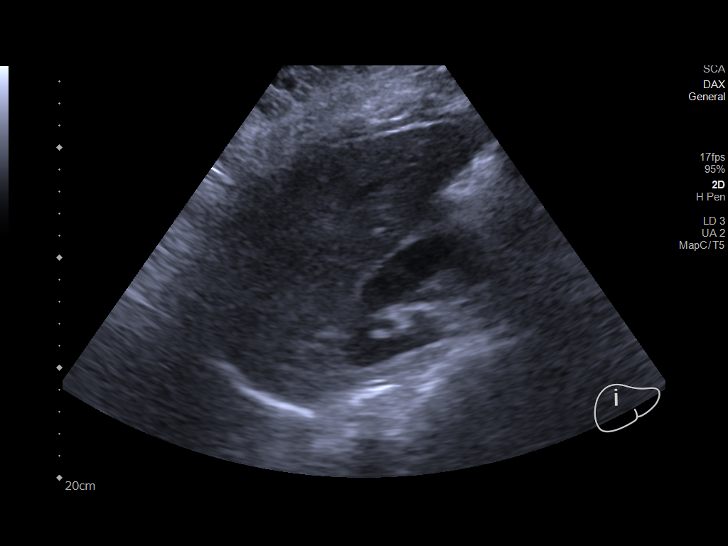
[im 27/30]
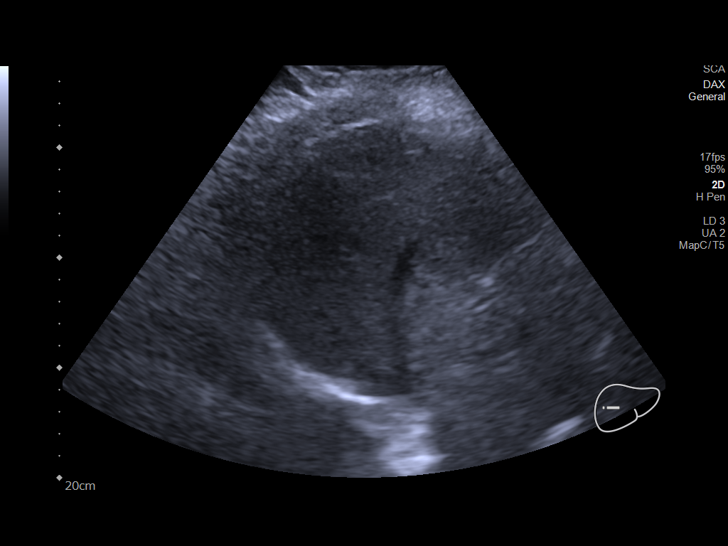
[im 30/30]
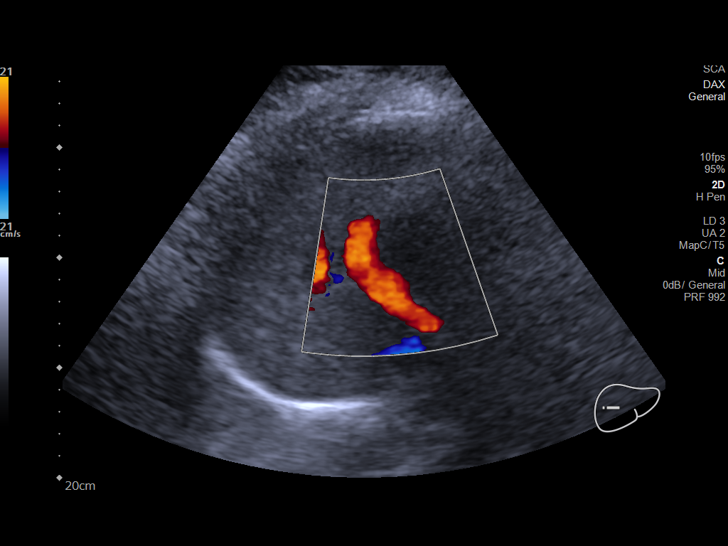

[14 of 25 positions shown; findings below may reference images not displayed]

FINDINGS: Gallbladder:

Gallbladder filled with multiple shadowing calculi. Largest discrete
calculus 9 mm diameter. Borderline gallbladder wall thickening. No
pericholecystic fluid. Sonographic Murphy sign present.

Common bile duct:

Diameter: 2 mm, normal

Liver:

Heterogeneous slightly increased hepatic parenchymal echogenicity
question fatty infiltration. No gross hepatic mass identified though
assessment of intrahepatic detail is suboptimal due to body habitus
and sound attenuation. Portal vein is patent on color Doppler
imaging with normal direction of blood flow towards the liver.

Other: No RIGHT upper quadrant free fluid.
IMPRESSION: Cholelithiasis with borderline gallbladder wall thickening and
presence of a sonographic Murphy sign suspicious for acute
cholecystitis.

Probable fatty infiltration of liver as above.

## 2022-01-03 IMAGING — US US OB < 14 WEEKS - US OB TV
1 series · 14 of 28 positions shown · non-contrast
Comparison: No prior.

CLINICAL DATA: Pelvic pain.  Vaginal bleeding.

EXAM:
OBSTETRIC <14 WK US AND TRANSVAGINAL OB US
TECHNIQUE: Both transabdominal and transvaginal ultrasound examinations were
performed for complete evaluation of the gestation as well as the
maternal uterus, adnexal regions, and pelvic cul-de-sac.
Transvaginal technique was performed to assess early pregnancy.

[Series 1: us ob < 14 weeks - us ob tv · 14 of 128 slices shown]
[im 5/128]
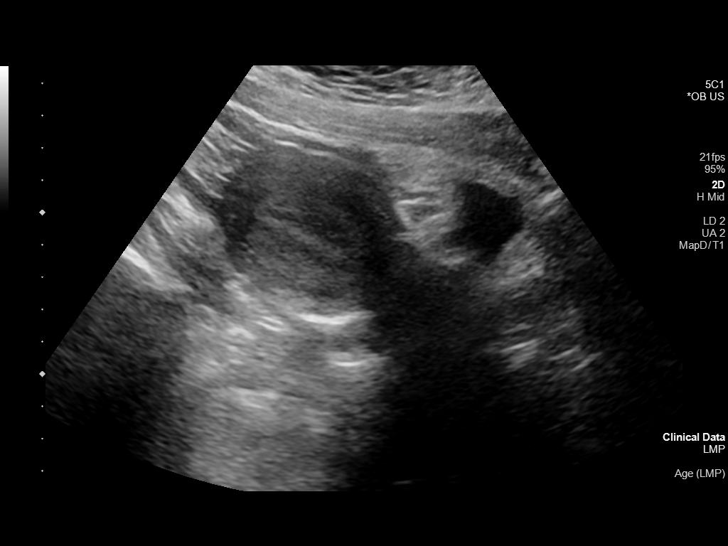
[im 15/128]
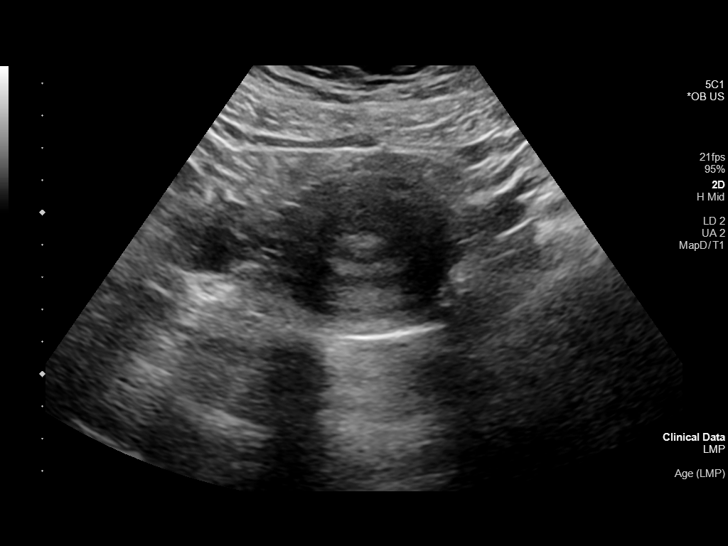
[im 24/128]
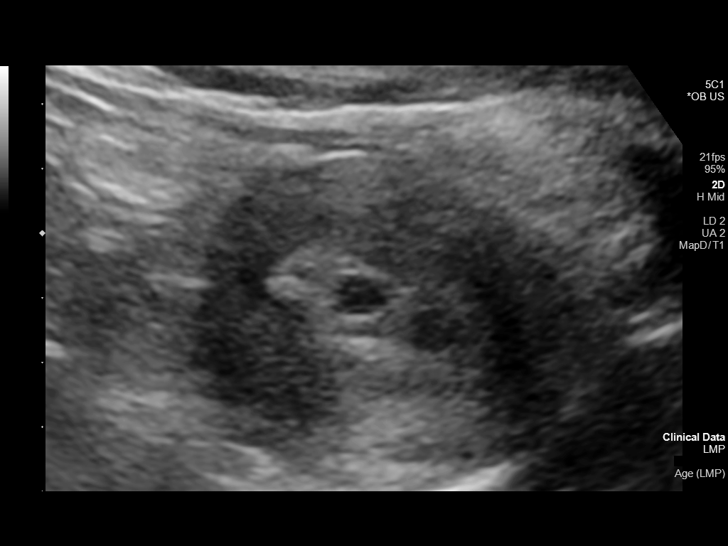
[im 33/128]
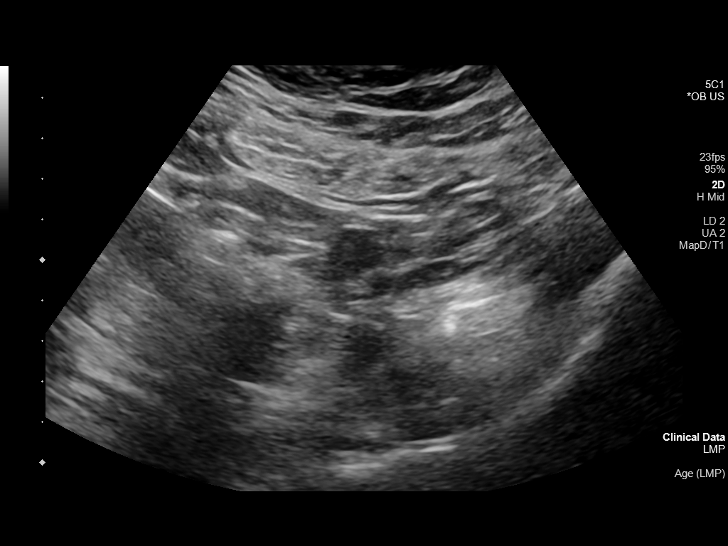
[im 43/128]
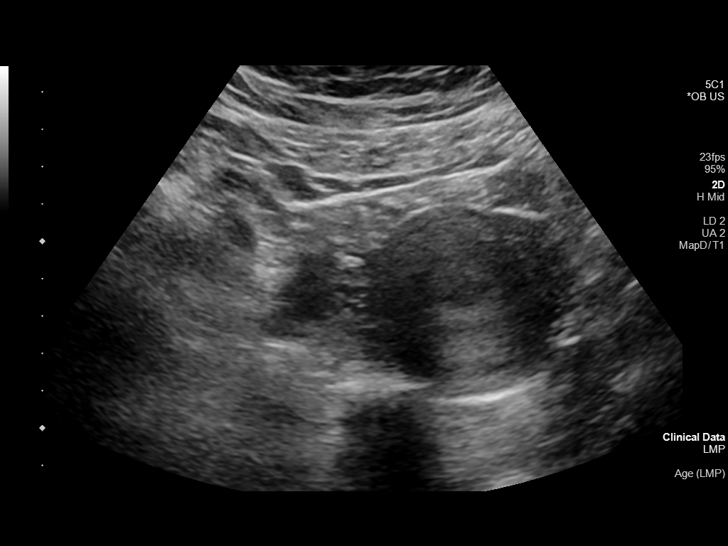
[im 52/128]
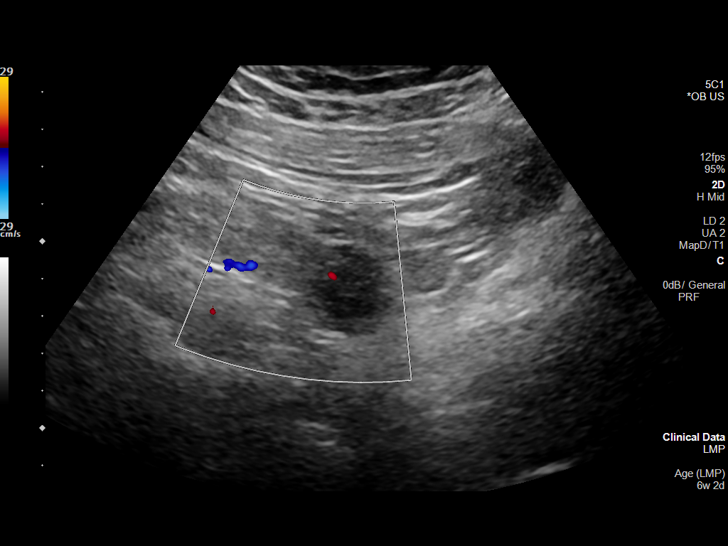
[im 62/128]
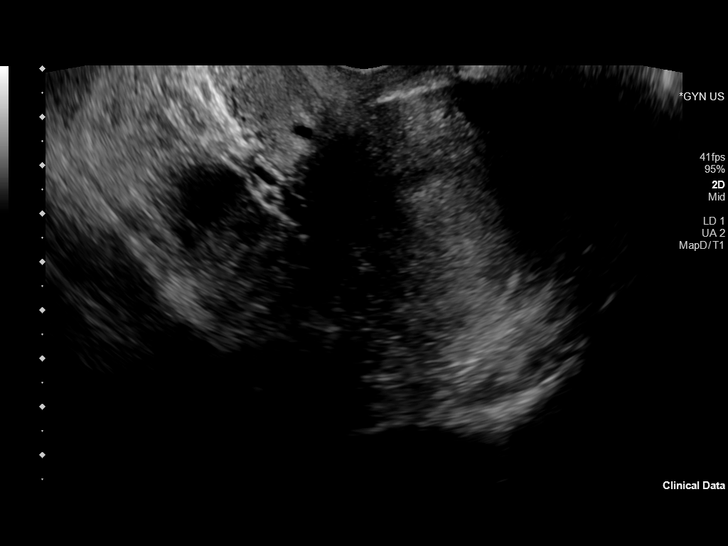
[im 71/128]
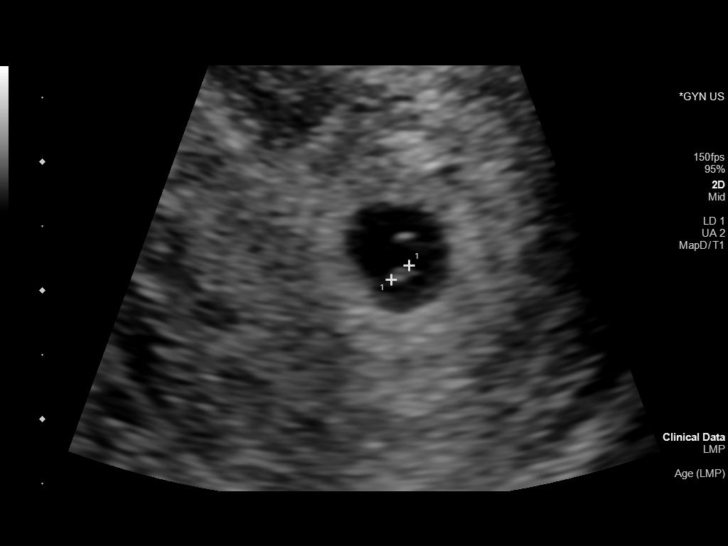
[im 80/128]
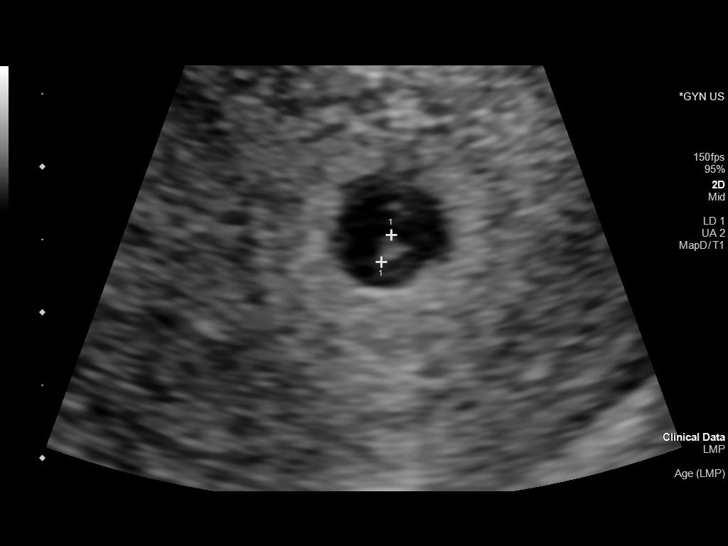
[im 90/128]
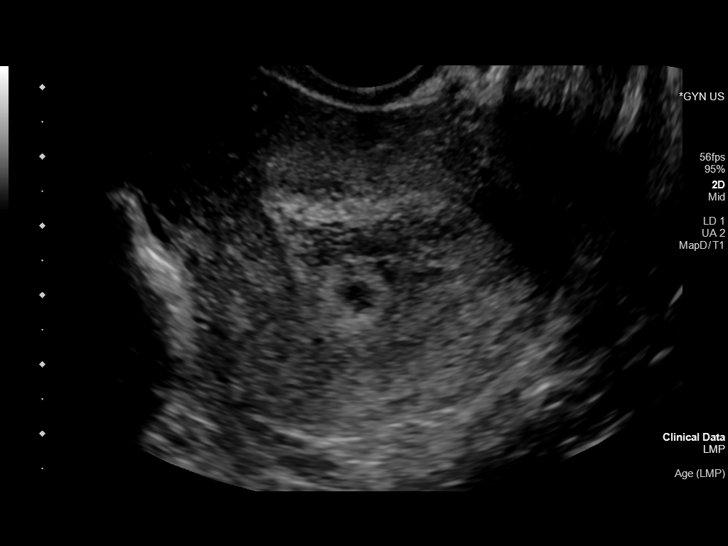
[im 99/128]
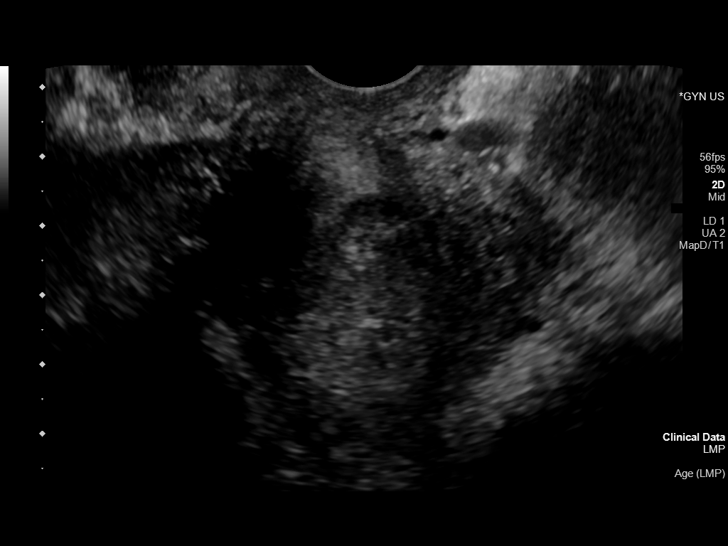
[im 109/128]
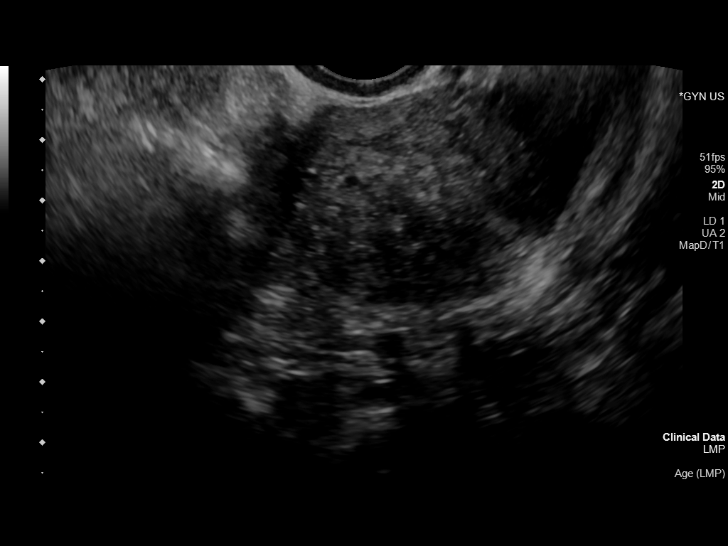
[im 118/128]
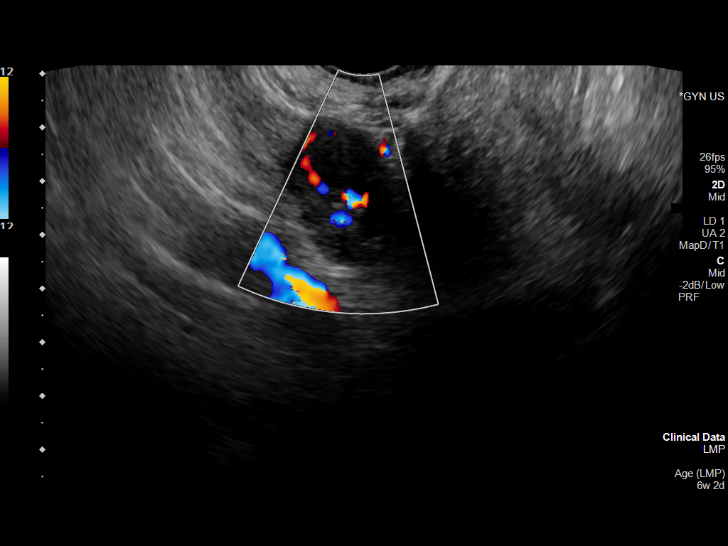
[im 128/128]
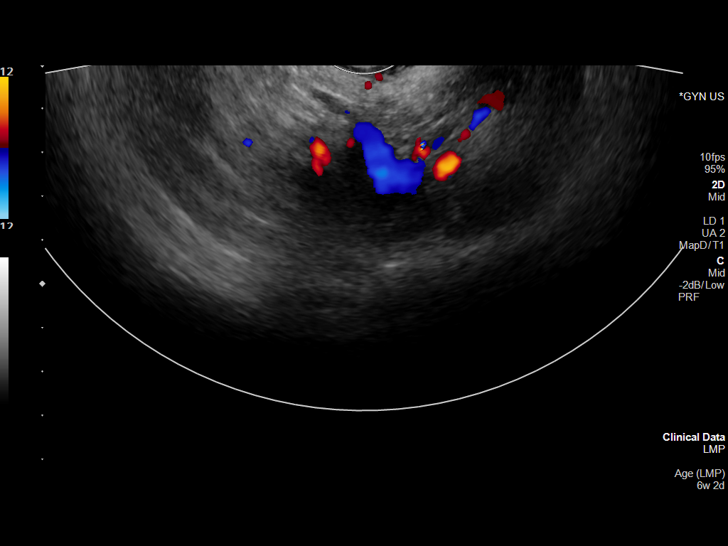

[14 of 28 positions shown; findings below may reference images not displayed]

FINDINGS: Intrauterine gestational sac: Single

Yolk sac:  Present

Embryo:  Present

Cardiac Activity: Present

Heart Rate: 92 bpm

CRL: 2.3 mm   5 w    d                  US EDC: 08/15/2021

Subchorionic hemorrhage:  Moderate subchorionic hemorrhage noted.

Maternal uterus/adnexae: Unremarkable.  No free fluid.
IMPRESSION: 1. Single viable intrauterine pregnancy of 5 weeks 5 days. Fetal
heart rate 92 beats per minute.

2.  Moderate size subchorionic hemorrhage.

## 2022-01-13 ENCOUNTER — Telehealth: Payer: Self-pay | Admitting: Obstetrics and Gynecology

## 2022-01-13 NOTE — Telephone Encounter (Signed)
Ms. Toman called to say she had to work and needed to reschedule her appointment. She stated she was not having any pain, and has not tried to feel for the string. She requested to be seen on the next available appointment with Rolitta.

## 2022-01-14 ENCOUNTER — Ambulatory Visit: Payer: Medicaid Other | Admitting: Obstetrics and Gynecology

## 2022-01-27 ENCOUNTER — Ambulatory Visit (INDEPENDENT_AMBULATORY_CARE_PROVIDER_SITE_OTHER): Payer: Medicaid Other | Admitting: Certified Nurse Midwife

## 2022-01-27 ENCOUNTER — Other Ambulatory Visit: Payer: Self-pay

## 2022-01-27 VITALS — BP 133/90 | HR 73 | Temp 98.2°F | Ht 64.0 in | Wt 322.6 lb

## 2022-01-27 DIAGNOSIS — T8332XA Displacement of intrauterine contraceptive device, initial encounter: Secondary | ICD-10-CM | POA: Diagnosis not present

## 2022-01-27 DIAGNOSIS — Z30431 Encounter for routine checking of intrauterine contraceptive device: Secondary | ICD-10-CM | POA: Diagnosis not present

## 2022-01-27 NOTE — Progress Notes (Signed)
° °  Subjective:   Patient Name: Lauren Morales, female   DOB: 1996-06-28, 26 y.o.  MRN: [id number]  HPI Patient here for an IUD check.  She had the Abram IUD placed 5 weeks ago and was not initially having and trouble, was unable to make her string check appointment. A few days ago began having cramping and heavy bleeding, noted the IUD strings are visible just outside her vagina. Concerned the IUD has been expulsed.   Review of Systems  Constitutional: Negative for fever and chills.  Gastrointestinal: Negative for abdominal pain.  Genitourinary: Negative for vaginal discharge/odor, is having bleeding/was having cramping but none today.       Objective:   Physical Exam  Constitutional: She appears well-developed and well-nourished.  HENT:  Head: Normocephalic and atraumatic.  Abdominal: Soft. There is no tenderness. There is no guarding.  Genitourinary: There is no rash, tenderness or lesion on the right labia. There is no rash, tenderness or lesion on the left labia. No erythema or tenderness in the vagina. No foreign body around the vagina. No signs of injury around the vagina. Small amount of vaginal bleeding is noted with a small clot at the introitus. Blood is dark red resembling menstrual bleeding. Strings present, IUD not visualized in vagina, nor is it protruding from the cervix. Possible that IUD is now in the lower uterine segment and the cervix has lowered due to menses.    Skin: Skin is warm and dry.  Psychiatric: She has a normal mood and affect. Her behavior is normal. Judgment and thought content normal.       Assessment & Plan:  1. IUD check up - IUD in place, has possible migrated to lower uterine segment. Recommend pelvic ultrasound in one week to check placement with back method used until then.   Will schedule transvaginal u/s and follow up accordingly.  Gaylan Gerold, CNM, MSN, [place] Certified Nurse Midwife, [facility]

## 2022-02-04 ENCOUNTER — Other Ambulatory Visit: Payer: Self-pay

## 2022-02-04 ENCOUNTER — Ambulatory Visit
Admission: RE | Admit: 2022-02-04 | Discharge: 2022-02-04 | Disposition: A | Discharge status: Home / Self Care | Payer: Medicaid Other | Source: Ambulatory Visit | Attending: Certified Nurse Midwife | Admitting: Certified Nurse Midwife

## 2022-02-04 DIAGNOSIS — T8332XA Displacement of intrauterine contraceptive device, initial encounter: Secondary | ICD-10-CM | POA: Diagnosis present

## 2022-02-07 ENCOUNTER — Encounter: Payer: Self-pay | Admitting: Certified Nurse Midwife

## 2022-02-11 ENCOUNTER — Ambulatory Visit: Payer: Medicaid Other | Admitting: Obstetrics and Gynecology

## 2022-07-12 ENCOUNTER — Other Ambulatory Visit (HOSPITAL_COMMUNITY): Payer: Self-pay

## 2023-03-13 ENCOUNTER — Emergency Department (HOSPITAL_COMMUNITY)
Admission: EM | Admit: 2023-03-13 | Discharge: 2023-03-14 | Disposition: A | Discharge status: Home / Self Care | Payer: Self-pay | Attending: Emergency Medicine | Admitting: Emergency Medicine

## 2023-03-13 ENCOUNTER — Emergency Department (HOSPITAL_COMMUNITY): Payer: Self-pay

## 2023-03-13 ENCOUNTER — Encounter (HOSPITAL_COMMUNITY): Payer: Self-pay

## 2023-03-13 ENCOUNTER — Other Ambulatory Visit: Payer: Self-pay

## 2023-03-13 DIAGNOSIS — K8020 Calculus of gallbladder without cholecystitis without obstruction: Secondary | ICD-10-CM | POA: Insufficient documentation

## 2023-03-13 DIAGNOSIS — I10 Essential (primary) hypertension: Secondary | ICD-10-CM | POA: Insufficient documentation

## 2023-03-13 DIAGNOSIS — D72829 Elevated white blood cell count, unspecified: Secondary | ICD-10-CM | POA: Insufficient documentation

## 2023-03-13 DIAGNOSIS — D649 Anemia, unspecified: Secondary | ICD-10-CM | POA: Insufficient documentation

## 2023-03-13 DIAGNOSIS — K8050 Calculus of bile duct without cholangitis or cholecystitis without obstruction: Secondary | ICD-10-CM

## 2023-03-13 HISTORY — DX: Obesity, unspecified: E66.9

## 2023-03-13 LAB — URINALYSIS, ROUTINE W REFLEX MICROSCOPIC
Bacteria, UA: NONE SEEN
Bilirubin Urine: NEGATIVE
Glucose, UA: NEGATIVE mg/dL
Ketones, ur: 20 mg/dL — AB
Nitrite: NEGATIVE
Protein, ur: 30 mg/dL — AB
Specific Gravity, Urine: 1.032 — ABNORMAL HIGH (ref 1.005–1.030)
pH: 5 (ref 5.0–8.0)

## 2023-03-13 LAB — CBC WITH DIFFERENTIAL/PLATELET
Abs Immature Granulocytes: 0.03 10*3/uL (ref 0.00–0.07)
Basophils Absolute: 0.1 10*3/uL (ref 0.0–0.1)
Basophils Relative: 0 %
Eosinophils Absolute: 0 10*3/uL (ref 0.0–0.5)
Eosinophils Relative: 0 %
HCT: 35.8 % — ABNORMAL LOW (ref 36.0–46.0)
Hemoglobin: 10.7 g/dL — ABNORMAL LOW (ref 12.0–15.0)
Immature Granulocytes: 0 %
Lymphocytes Relative: 17 %
Lymphs Abs: 2.2 10*3/uL (ref 0.7–4.0)
MCH: 22.7 pg — ABNORMAL LOW (ref 26.0–34.0)
MCHC: 29.9 g/dL — ABNORMAL LOW (ref 30.0–36.0)
MCV: 75.8 fL — ABNORMAL LOW (ref 80.0–100.0)
Monocytes Absolute: 0.8 10*3/uL (ref 0.1–1.0)
Monocytes Relative: 6 %
Neutro Abs: 9.8 10*3/uL — ABNORMAL HIGH (ref 1.7–7.7)
Neutrophils Relative %: 77 %
Platelets: 497 10*3/uL — ABNORMAL HIGH (ref 150–400)
RBC: 4.72 MIL/uL (ref 3.87–5.11)
RDW: 16.5 % — ABNORMAL HIGH (ref 11.5–15.5)
WBC: 12.9 10*3/uL — ABNORMAL HIGH (ref 4.0–10.5)
nRBC: 0 % (ref 0.0–0.2)

## 2023-03-13 LAB — COMPREHENSIVE METABOLIC PANEL
ALT: 16 U/L (ref 0–44)
AST: 17 U/L (ref 15–41)
Albumin: 3.7 g/dL (ref 3.5–5.0)
Alkaline Phosphatase: 63 U/L (ref 38–126)
Anion gap: 10 (ref 5–15)
BUN: 9 mg/dL (ref 6–20)
CO2: 21 mmol/L — ABNORMAL LOW (ref 22–32)
Calcium: 8.7 mg/dL — ABNORMAL LOW (ref 8.9–10.3)
Chloride: 103 mmol/L (ref 98–111)
Creatinine, Ser: 0.67 mg/dL (ref 0.44–1.00)
GFR, Estimated: >60 mL/min (ref >60)
Glucose, Bld: 96 mg/dL (ref 70–99)
Potassium: 3.7 mmol/L (ref 3.5–5.1)
Sodium: 134 mmol/L — ABNORMAL LOW (ref 135–145)
Total Bilirubin: 1 mg/dL (ref 0.3–1.2)
Total Protein: 7.8 g/dL (ref 6.5–8.1)

## 2023-03-13 LAB — I-STAT BETA HCG BLOOD, ED (MC, WL, AP ONLY): I-stat hCG, quantitative: <5 m[IU]/mL (ref <5)

## 2023-03-13 LAB — LIPASE, BLOOD: Lipase: 26 U/L (ref 11–51)

## 2023-03-13 MED ORDER — MORPHINE SULFATE (PF) 4 MG/ML IV SOLN
4.0000 mg | Freq: Once | INTRAVENOUS | Status: AC
  Administered 2023-03-13: 4 mg via INTRAVENOUS
  Filled 2023-03-13: qty 1

## 2023-03-13 MED ORDER — ONDANSETRON HCL 4 MG/2ML IJ SOLN
4.0000 mg | Freq: Once | INTRAMUSCULAR | Status: AC
  Administered 2023-03-13: 4 mg via INTRAVENOUS
  Filled 2023-03-13: qty 2

## 2023-03-13 NOTE — ED Provider Notes (Signed)
Nursing notes and vitals signs, including pulse oximetry, reviewed.  Summary of this visit's results, reviewed by myself:  EKG:  EKG Interpretation  Date/Time:    Ventricular Rate:    PR Interval:    QRS Duration:   QT Interval:    QTC Calculation:   R Axis:     Text Interpretation:          Labs:  Results for orders placed or performed during the hospital encounter of 03/13/23 (from the past 24 hour(s))  I-Stat beta hCG blood, ED     Status: None   Collection Time: 03/13/23  8:48 PM  Result Value Ref Range   I-stat hCG, quantitative <5.0 <5 mIU/mL   Comment 3          Urinalysis, Routine w reflex microscopic -Urine, Clean Catch     Status: Abnormal   Collection Time: 03/13/23  8:57 PM  Result Value Ref Range   Color, Urine YELLOW YELLOW   APPearance CLOUDY (A) CLEAR   Specific Gravity, Urine 1.032 (H) 1.005 - 1.030   pH 5.0 5.0 - 8.0   Glucose, UA NEGATIVE NEGATIVE mg/dL   Hgb urine dipstick LARGE (A) NEGATIVE   Bilirubin Urine NEGATIVE NEGATIVE   Ketones, ur 20 (A) NEGATIVE mg/dL   Protein, ur 30 (A) NEGATIVE mg/dL   Nitrite NEGATIVE NEGATIVE   Leukocytes,Ua SMALL (A) NEGATIVE   RBC / HPF 11-20 0 - 5 RBC/hpf   WBC, UA 21-50 0 - 5 WBC/hpf   Bacteria, UA NONE SEEN NONE SEEN   Squamous Epithelial / HPF 21-50 0 - 5 /HPF   Mucus PRESENT   Comprehensive metabolic panel     Status: Abnormal   Collection Time: 03/13/23  8:59 PM  Result Value Ref Range   Sodium 134 (L) 135 - 145 mmol/L   Potassium 3.7 3.5 - 5.1 mmol/L   Chloride 103 98 - 111 mmol/L   CO2 21 (L) 22 - 32 mmol/L   Glucose, Bld 96 70 - 99 mg/dL   BUN 9 6 - 20 mg/dL   Creatinine, Ser 0.67 0.44 - 1.00 mg/dL   Calcium 8.7 (L) 8.9 - 10.3 mg/dL   Total Protein 7.8 6.5 - 8.1 g/dL   Albumin 3.7 3.5 - 5.0 g/dL   AST 17 15 - 41 U/L   ALT 16 0 - 44 U/L   Alkaline Phosphatase 63 38 - 126 U/L   Total Bilirubin 1.0 0.3 - 1.2 mg/dL   GFR, Estimated >60 >60 mL/min   Anion gap 10 5 - 15  Lipase, blood      Status: None   Collection Time: 03/13/23  8:59 PM  Result Value Ref Range   Lipase 26 11 - 51 U/L  CBC with Diff     Status: Abnormal   Collection Time: 03/13/23  8:59 PM  Result Value Ref Range   WBC 12.9 (H) 4.0 - 10.5 K/uL   RBC 4.72 3.87 - 5.11 MIL/uL   Hemoglobin 10.7 (L) 12.0 - 15.0 g/dL   HCT 35.8 (L) 36.0 - 46.0 %   MCV 75.8 (L) 80.0 - 100.0 fL   MCH 22.7 (L) 26.0 - 34.0 pg   MCHC 29.9 (L) 30.0 - 36.0 g/dL   RDW 16.5 (H) 11.5 - 15.5 %   Platelets 497 (H) 150 - 400 K/uL   nRBC 0.0 0.0 - 0.2 %   Neutrophils Relative % 77 %   Neutro Abs 9.8 (H) 1.7 - 7.7 K/uL   Lymphocytes  Relative 17 %   Lymphs Abs 2.2 0.7 - 4.0 K/uL   Monocytes Relative 6 %   Monocytes Absolute 0.8 0.1 - 1.0 K/uL   Eosinophils Relative 0 %   Eosinophils Absolute 0.0 0.0 - 0.5 K/uL   Basophils Relative 0 %   Basophils Absolute 0.1 0.0 - 0.1 K/uL   Immature Granulocytes 0 %   Abs Immature Granulocytes 0.03 0.00 - 0.07 K/uL  Urinalysis, Routine w reflex microscopic -Urine, Clean Catch     Status: Abnormal   Collection Time: 03/13/23 11:42 PM  Result Value Ref Range   Color, Urine YELLOW YELLOW   APPearance CLOUDY (A) CLEAR   Specific Gravity, Urine 1.029 1.005 - 1.030   pH 6.0 5.0 - 8.0   Glucose, UA NEGATIVE NEGATIVE mg/dL   Hgb urine dipstick LARGE (A) NEGATIVE   Bilirubin Urine NEGATIVE NEGATIVE   Ketones, ur 80 (A) NEGATIVE mg/dL   Protein, ur 30 (A) NEGATIVE mg/dL   Nitrite NEGATIVE NEGATIVE   Leukocytes,Ua SMALL (A) NEGATIVE   RBC / HPF 11-20 0 - 5 RBC/hpf   WBC, UA 11-20 0 - 5 WBC/hpf   Bacteria, UA RARE (A) NONE SEEN   Squamous Epithelial / HPF 21-50 0 - 5 /HPF   Mucus PRESENT     Imaging Studies: US Abdomen Limited RUQ (LIVER/GB)  Result Date: 03/13/2023 CLINICAL DATA:  Right upper quadrant abdominal pain. EXAM: ULTRASOUND ABDOMEN LIMITED RIGHT UPPER QUADRANT COMPARISON:  Ultrasound dated 12/05/2020. FINDINGS: Gallbladder: Multiple stones within the gallbladder. No gallbladder wall  thickening or pericholecystic fluid. Negative sonographic Murphy's sign. Common bile duct: Diameter: 6 mm Liver: No focal lesion identified. Within normal limits in parenchymal echogenicity. Portal vein is patent on color Doppler imaging with normal direction of blood flow towards the liver. Other: None. IMPRESSION: Cholelithiasis without sonographic evidence of acute cholecystitis. Electronically Signed   By: Anner Crete M.D.   On: 03/13/2023 23:14    12:06 AM The patient's repeat your analysis is still contaminated with multiple squamous epithelial cells.  It is unclear if this represents a UTI or not.  We will treat the patient with a dose of fosfomycin as a precaution.  Her ultrasound is showing cholelithiasis without evidence of acute cholecystitis.  She was advised to return if symptoms worsen.   Zanna Hawn, Jenny Reichmann, MD 03/14/23 0010

## 2023-03-13 NOTE — ED Triage Notes (Signed)
Pt arrived POV with c/o mid abd pain for several days, unable to eat, has been able to drink water. Pt reports also experiencing n/v/d. Pt unsure of pregnancy, reports no hx of gallbladder issues, hx reports cholelithiasis in 2021. VSS, NAD noted

## 2023-03-13 NOTE — ED Provider Notes (Signed)
[facility] AT [facility] Provider Note   CSN: [id number] Arrival date & time: 03/13/23  1932     History  Chief Complaint  Patient presents with   Abdominal Pain    Lauren Morales is a 27 y.o. female with HTN, obesity, G1P1 presents with abd pain.   Pt arrived POV with c/o mid abd pain for several days, constant and worsening, a/w N/V/D, unable to eat, has been able to drink water. Unsure of alleviating/aggravating factors. Denies fevers, chest pain, SOB, urinary symptoms, vaginal symptoms. Pt unsure of pregnancy, reports no hx of gallbladder issues, hx reports cholelithiasis in 2021. Only abdominal surgical history is C-section.    Abdominal Pain      Home Medications Prior to Admission medications   Medication Sig Start Date End Date Taking? Authorizing Provider  HYDROcodone-acetaminophen (NORCO) 5-325 MG tablet Take 1 tablet by mouth every 6 (six) hours as needed for severe pain. 03/14/23  Yes Molpus, John, MD  ondansetron (ZOFRAN-ODT) 8 MG disintegrating tablet Take 1 tablet (8 mg total) by mouth every 8 (eight) hours as needed. 03/14/23  Yes Molpus, John, MD      Allergies    Patient has no known allergies.    Review of Systems   Review of Systems  Gastrointestinal:  Positive for abdominal pain.   Review of systems Negative for fever.  A 10 point review of systems was performed and is negative unless otherwise reported in HPI.  Physical Exam Updated Vital Signs BP 133/89   Pulse 86   Temp 98.7 F (37.1 C) (Oral)   Resp 16   Ht 5\' 5"  (1.651 m)   Wt 136.1 kg   LMP 03/02/2023 (Exact Date)   SpO2 97%   BMI 49.92 kg/m  Physical Exam General: Normal appearing female, lying in bed.  HEENT: Sclera anicteric, MMM, trachea midline.  Cardiology: RRR, no murmurs/rubs/gallops.  Resp: Normal respiratory rate and effort. CTAB, no wheezes, rhonchi, crackles.  Abd: RUQ, epigastric, periumbilical abdominal TTP. Soft, non-distended. No rebound  tenderness or guarding.  GU: Deferred. MSK: No peripheral edema or signs of trauma. Extremities without deformity or TTP. No cyanosis or clubbing. Skin: warm, dry.  Back: No CVA tenderness Neuro: A&Ox4, CNs II-XII grossly intact. MAEs. Sensation grossly intact.  Psych: Normal mood and affect.   ED Results / Procedures / Treatments   Labs (all labs ordered are listed, but only abnormal results are displayed) Labs Reviewed  COMPREHENSIVE METABOLIC PANEL - Abnormal; Notable for the following components:      Result Value   Sodium 134 (*)    CO2 21 (*)    Calcium 8.7 (*)    All other components within normal limits  CBC WITH DIFFERENTIAL/PLATELET - Abnormal; Notable for the following components:   WBC 12.9 (*)    Hemoglobin 10.7 (*)    HCT 35.8 (*)    MCV 75.8 (*)    MCH 22.7 (*)    MCHC 29.9 (*)    RDW 16.5 (*)    Platelets 497 (*)    Neutro Abs 9.8 (*)    All other components within normal limits  URINALYSIS, ROUTINE W REFLEX MICROSCOPIC - Abnormal; Notable for the following components:   APPearance CLOUDY (*)    Specific Gravity, Urine 1.032 (*)    Hgb urine dipstick LARGE (*)    Ketones, ur 20 (*)    Protein, ur 30 (*)    Leukocytes,Ua SMALL (*)    All other components within  normal limits  URINALYSIS, ROUTINE W REFLEX MICROSCOPIC - Abnormal; Notable for the following components:   APPearance CLOUDY (*)    Hgb urine dipstick LARGE (*)    Ketones, ur 80 (*)    Protein, ur 30 (*)    Leukocytes,Ua SMALL (*)    Bacteria, UA RARE (*)    All other components within normal limits  LIPASE, BLOOD  I-STAT BETA HCG BLOOD, ED ([facility], [facility], [facility] ONLY)    EKG None  Radiology No results found.  Procedures Procedures    Medications Ordered in ED Medications  ondansetron (ZOFRAN) injection 4 mg (4 mg Intravenous Given 03/13/23 2238)  morphine (PF) 4 MG/ML injection 4 mg (4 mg Intravenous Given 03/13/23 2238)  morphine (PF) 4 MG/ML injection 4 mg (4 mg Intravenous Given 03/13/23  2348)  fosfomycin (MONUROL) packet 3 g (3 g Oral Given 03/14/23 0012)    ED Course/ Medical Decision Making/ A&P                          Medical Decision Making Amount and/or Complexity of Data Reviewed Labs: ordered. Decision-making details documented in ED Course. Radiology: ordered.  Risk Prescription drug management.    This patient presents to the ED for concern of abdominal, this involves an extensive number of treatment options, and is a complaint that carries with it a high risk of complications and morbidity.  I considered the following differential and admission for this acute, potentially life threatening condition. Patient is HDS and overall well-appearing, afebrile.  MDM:    For DDX for abdominal pain includes but is not limited to:  Abdominal exam without peritoneal signs. No evidence of acute abdomen at this time.   Given patient's history of cholelithiasis now with abdominal pain, consider biliary disease including cholelithiasis, cholecystitis/cholangitis the patient is afebrile and overall well-appearing.  Obtain right upper quadrant ultrasound for further assessment. also consider pancreatitis, GERD/gastritis.  Lower concern for pyelonephritis, acute appendicitis, vascular catastrophe, bowel obstruction, viscus perforation, diverticulitis, given abdominal exam and history.   Clinical Course as of 03/23/23 1600  Sun Mar 13, 2023  2154 I-stat hCG, quantitative: <5.0 [HN]  2154 WBC(!): 12.9 [HN]  2154 Hemoglobin(!): 10.7 [HN]  2154 Lipase: 26 [HN]  2234 Squamous Epithelial / HPF: 21-50 This UA not interpretable d/t high number of squams [HN]  2234 Comprehensive metabolic panel(!) Na 134, otherwise unremarkable, normal HFP [HN]    Clinical Course User Index [HN] Loetta Rough, MD    Labs: I Ordered, and personally interpreted labs.  The pertinent results include:  those listed above  Imaging Studies ordered: I ordered imaging studies including RUQ  [place]  Additional history obtained from chart review  Reevaluation: After the interventions noted above, I reevaluated the patient and found that they have :stayed the same  Social Determinants of Health: Patient lives independently   Disposition:  Patient with mild leukocytosis and anemia.  Patient is signed out to the oncoming ED physician Dr. Read Drivers who is made aware of her history, presentation, exam, workup, and plan.  Plan is to obtain right upper quadrant ultrasound and repeat UA.   Co morbidities that complicate the patient evaluation  Past Medical History:  Diagnosis Date   Anemia    Cholelithiasis 12/04/2020   Hypertension    Obesity      Medicines Meds ordered this encounter  Medications   ondansetron (ZOFRAN) injection 4 mg   morphine (PF) 4 MG/ML injection 4 mg  morphine (PF) 4 MG/ML injection 4 mg   fosfomycin (MONUROL) packet 3 g   ondansetron (ZOFRAN-ODT) 8 MG disintegrating tablet    Sig: Take 1 tablet (8 mg total) by mouth every 8 (eight) hours as needed.    Dispense:  10 tablet    Refill:  0   HYDROcodone-acetaminophen (NORCO) 5-325 MG tablet    Sig: Take 1 tablet by mouth every 6 (six) hours as needed for severe pain.    Dispense:  20 tablet    Refill:  0    I have reviewed the patients home medicines and have made adjustments as needed  Problem List / ED Course: Problem List Items Addressed This Visit   None Visit Diagnoses     Biliary colic    -  Primary                   This note was created using dictation software, which may contain spelling or grammatical errors.    Loetta Rough, MD 03/23/23 [phone]

## 2023-03-14 LAB — URINALYSIS, ROUTINE W REFLEX MICROSCOPIC
Bilirubin Urine: NEGATIVE
Glucose, UA: NEGATIVE mg/dL
Ketones, ur: 80 mg/dL — AB
Nitrite: NEGATIVE
Protein, ur: 30 mg/dL — AB
Specific Gravity, Urine: 1.029 (ref 1.005–1.030)
pH: 6 (ref 5.0–8.0)

## 2023-03-14 MED ORDER — HYDROCODONE-ACETAMINOPHEN 5-325 MG PO TABS: 1.0000 | ORAL_TABLET | Freq: Four times a day (QID) | ORAL | 0 refills | Status: AC | PRN

## 2023-03-14 MED ORDER — FOSFOMYCIN TROMETHAMINE 3 G PO PACK
3.0000 g | PACK | Freq: Once | ORAL | Status: AC
  Administered 2023-03-14: 3 g via ORAL
  Filled 2023-03-14: qty 3

## 2023-03-14 MED ORDER — ONDANSETRON 8 MG PO TBDP: 8.0000 mg | ORAL_TABLET | Freq: Three times a day (TID) | ORAL | 0 refills | Status: AC | PRN
